# Patient Record
Sex: Male | Born: 1945 | Race: White | Hispanic: No | Marital: Single | Smoking: Former smoker
Health system: Southern US, Community
[De-identification: ages and names within clinical notes are randomized; demographics above are authoritative.]

## PROBLEM LIST (undated history)

## (undated) DIAGNOSIS — E785 Hyperlipidemia, unspecified: Secondary | ICD-10-CM

## (undated) DIAGNOSIS — I10 Essential (primary) hypertension: Secondary | ICD-10-CM

## (undated) DIAGNOSIS — I4891 Unspecified atrial fibrillation: Secondary | ICD-10-CM

## (undated) DIAGNOSIS — F419 Anxiety disorder, unspecified: Secondary | ICD-10-CM

---

## 1981-09-20 HISTORY — PX: BACK SURGERY: SHX140

## 2015-09-14 ENCOUNTER — Inpatient Hospital Stay (HOSPITAL_COMMUNITY)
Admission: EM | Admit: 2015-09-14 | Discharge: 2015-09-21 | DRG: 233 | Disposition: E | Payer: Medicare HMO | Attending: Surgery | Admitting: Surgery

## 2015-09-14 ENCOUNTER — Encounter (HOSPITAL_COMMUNITY): Admission: EM | Disposition: E | Payer: Medicare HMO | Source: Home / Self Care | Attending: Surgery

## 2015-09-14 ENCOUNTER — Encounter (HOSPITAL_COMMUNITY): Payer: Self-pay

## 2015-09-14 ENCOUNTER — Emergency Department (HOSPITAL_COMMUNITY): Payer: Medicare HMO

## 2015-09-14 DIAGNOSIS — Z951 Presence of aortocoronary bypass graft: Secondary | ICD-10-CM

## 2015-09-14 DIAGNOSIS — D72829 Elevated white blood cell count, unspecified: Secondary | ICD-10-CM | POA: Diagnosis present

## 2015-09-14 DIAGNOSIS — R739 Hyperglycemia, unspecified: Secondary | ICD-10-CM | POA: Diagnosis not present

## 2015-09-14 DIAGNOSIS — D62 Acute posthemorrhagic anemia: Secondary | ICD-10-CM | POA: Diagnosis not present

## 2015-09-14 DIAGNOSIS — R079 Chest pain, unspecified: Secondary | ICD-10-CM | POA: Diagnosis not present

## 2015-09-14 DIAGNOSIS — E785 Hyperlipidemia, unspecified: Secondary | ICD-10-CM | POA: Diagnosis present

## 2015-09-14 DIAGNOSIS — I209 Angina pectoris, unspecified: Secondary | ICD-10-CM

## 2015-09-14 DIAGNOSIS — Z66 Do not resuscitate: Secondary | ICD-10-CM | POA: Diagnosis present

## 2015-09-14 DIAGNOSIS — Z4509 Encounter for adjustment and management of other cardiac device: Secondary | ICD-10-CM

## 2015-09-14 DIAGNOSIS — E872 Acidosis: Secondary | ICD-10-CM | POA: Diagnosis not present

## 2015-09-14 DIAGNOSIS — T508X5A Adverse effect of diagnostic agents, initial encounter: Secondary | ICD-10-CM | POA: Diagnosis not present

## 2015-09-14 DIAGNOSIS — I10 Essential (primary) hypertension: Secondary | ICD-10-CM | POA: Diagnosis not present

## 2015-09-14 DIAGNOSIS — J810 Acute pulmonary edema: Secondary | ICD-10-CM | POA: Diagnosis present

## 2015-09-14 DIAGNOSIS — N183 Chronic kidney disease, stage 3 (moderate): Secondary | ICD-10-CM | POA: Diagnosis present

## 2015-09-14 DIAGNOSIS — R579 Shock, unspecified: Secondary | ICD-10-CM | POA: Insufficient documentation

## 2015-09-14 DIAGNOSIS — Z87891 Personal history of nicotine dependence: Secondary | ICD-10-CM

## 2015-09-14 DIAGNOSIS — J9601 Acute respiratory failure with hypoxia: Secondary | ICD-10-CM | POA: Diagnosis not present

## 2015-09-14 DIAGNOSIS — I5021 Acute systolic (congestive) heart failure: Secondary | ICD-10-CM | POA: Diagnosis not present

## 2015-09-14 DIAGNOSIS — R570 Cardiogenic shock: Secondary | ICD-10-CM | POA: Diagnosis not present

## 2015-09-14 DIAGNOSIS — Z452 Encounter for adjustment and management of vascular access device: Secondary | ICD-10-CM

## 2015-09-14 DIAGNOSIS — I2510 Atherosclerotic heart disease of native coronary artery without angina pectoris: Secondary | ICD-10-CM | POA: Diagnosis not present

## 2015-09-14 DIAGNOSIS — F419 Anxiety disorder, unspecified: Secondary | ICD-10-CM | POA: Diagnosis present

## 2015-09-14 DIAGNOSIS — G9341 Metabolic encephalopathy: Secondary | ICD-10-CM | POA: Diagnosis not present

## 2015-09-14 DIAGNOSIS — N170 Acute kidney failure with tubular necrosis: Secondary | ICD-10-CM | POA: Diagnosis not present

## 2015-09-14 DIAGNOSIS — I4891 Unspecified atrial fibrillation: Secondary | ICD-10-CM | POA: Diagnosis not present

## 2015-09-14 DIAGNOSIS — Z01818 Encounter for other preprocedural examination: Secondary | ICD-10-CM

## 2015-09-14 DIAGNOSIS — Z515 Encounter for palliative care: Secondary | ICD-10-CM | POA: Diagnosis not present

## 2015-09-14 DIAGNOSIS — N141 Nephropathy induced by other drugs, medicaments and biological substances: Secondary | ICD-10-CM | POA: Diagnosis not present

## 2015-09-14 DIAGNOSIS — I482 Chronic atrial fibrillation: Secondary | ICD-10-CM | POA: Diagnosis present

## 2015-09-14 DIAGNOSIS — R0602 Shortness of breath: Secondary | ICD-10-CM | POA: Diagnosis present

## 2015-09-14 DIAGNOSIS — I130 Hypertensive heart and chronic kidney disease with heart failure and stage 1 through stage 4 chronic kidney disease, or unspecified chronic kidney disease: Secondary | ICD-10-CM | POA: Diagnosis present

## 2015-09-14 DIAGNOSIS — K559 Vascular disorder of intestine, unspecified: Secondary | ICD-10-CM | POA: Diagnosis not present

## 2015-09-14 DIAGNOSIS — I214 Non-ST elevation (NSTEMI) myocardial infarction: Principal | ICD-10-CM | POA: Diagnosis present

## 2015-09-14 DIAGNOSIS — K567 Ileus, unspecified: Secondary | ICD-10-CM

## 2015-09-14 DIAGNOSIS — E869 Volume depletion, unspecified: Secondary | ICD-10-CM | POA: Diagnosis not present

## 2015-09-14 HISTORY — DX: Anxiety disorder, unspecified: F41.9

## 2015-09-14 HISTORY — DX: Essential (primary) hypertension: I10

## 2015-09-14 HISTORY — DX: Unspecified atrial fibrillation: I48.91

## 2015-09-14 HISTORY — PX: CARDIAC CATHETERIZATION: SHX172

## 2015-09-14 HISTORY — DX: Hyperlipidemia, unspecified: E78.5

## 2015-09-14 LAB — COMPREHENSIVE METABOLIC PANEL
ALT: 32 U/L (ref 17–63)
AST: 29 U/L (ref 15–41)
Albumin: 3.6 g/dL (ref 3.5–5.0)
Alkaline Phosphatase: 95 U/L (ref 38–126)
Anion gap: 9 (ref 5–15)
BUN: 33 mg/dL — ABNORMAL HIGH (ref 6–20)
CHLORIDE: 110 mmol/L (ref 101–111)
CO2: 21 mmol/L — ABNORMAL LOW (ref 22–32)
CREATININE: 1.3 mg/dL — AB (ref 0.61–1.24)
Calcium: 9.9 mg/dL (ref 8.9–10.3)
GFR, EST NON AFRICAN AMERICAN: 55 mL/min — AB (ref >60)
Glucose, Bld: 180 mg/dL — ABNORMAL HIGH (ref 65–99)
POTASSIUM: 4.1 mmol/L (ref 3.5–5.1)
Sodium: 140 mmol/L (ref 135–145)
Total Bilirubin: 0.4 mg/dL (ref 0.3–1.2)
Total Protein: 7.1 g/dL (ref 6.5–8.1)

## 2015-09-14 LAB — CBC WITH DIFFERENTIAL/PLATELET
Basophils Absolute: 0.1 10*3/uL (ref 0.0–0.1)
Basophils Relative: 1 %
Eosinophils Absolute: 0.2 10*3/uL (ref 0.0–0.7)
Eosinophils Relative: 2 %
HCT: 37.9 % — ABNORMAL LOW (ref 39.0–52.0)
Hemoglobin: 12.5 g/dL — ABNORMAL LOW (ref 13.0–17.0)
Lymphocytes Relative: 13 %
Lymphs Abs: 1.4 10*3/uL (ref 0.7–4.0)
MCH: 29.8 pg (ref 26.0–34.0)
MCHC: 33 g/dL (ref 30.0–36.0)
MCV: 90.2 fL (ref 78.0–100.0)
Monocytes Absolute: 0.7 10*3/uL (ref 0.1–1.0)
Monocytes Relative: 6 %
Neutro Abs: 8.9 10*3/uL — ABNORMAL HIGH (ref 1.7–7.7)
Neutrophils Relative %: 78 %
Platelets: 216 10*3/uL (ref 150–400)
RBC: 4.2 MIL/uL — ABNORMAL LOW (ref 4.22–5.81)
RDW: 13.7 % (ref 11.5–15.5)
WBC: 11.2 10*3/uL — ABNORMAL HIGH (ref 4.0–10.5)

## 2015-09-14 LAB — I-STAT TROPONIN, ED: TROPONIN I, POC: 0.54 ng/mL — AB (ref 0.00–0.08)

## 2015-09-14 SURGERY — LEFT HEART CATH AND CORONARY ANGIOGRAPHY

## 2015-09-14 MED ORDER — HEPARIN SODIUM (PORCINE) 5000 UNIT/ML IJ SOLN
4000.0000 [IU] | Freq: Once | INTRAMUSCULAR | Status: AC
Start: 2015-09-14 — End: 2015-09-14
  Administered 2015-09-14: 4000 [IU] via INTRAVENOUS
  Filled 2015-09-14: qty 1

## 2015-09-14 MED ORDER — ASPIRIN 325 MG PO TABS
325.0000 mg | ORAL_TABLET | Freq: Once | ORAL | Status: AC
  Administered 2015-09-14: 325 mg via ORAL
  Filled 2015-09-14: qty 1

## 2015-09-14 MED ORDER — FUROSEMIDE 10 MG/ML IJ SOLN: 40.0000 mg | INTRAMUSCULAR | Status: DC

## 2015-09-14 MED ORDER — NITROGLYCERIN 1 MG/10 ML FOR IR/CATH LAB
INTRA_ARTERIAL | Status: AC
  Filled 2015-09-14: qty 10

## 2015-09-14 MED ORDER — HEPARIN BOLUS VIA INFUSION: 60.0000 [IU]/kg | Freq: Once | INTRAVENOUS | Status: DC

## 2015-09-14 MED ORDER — HEPARIN (PORCINE) IN NACL 2-0.9 UNIT/ML-% IJ SOLN
INTRAMUSCULAR | Status: AC
  Filled 2015-09-14: qty 1000

## 2015-09-14 MED ORDER — LIDOCAINE HCL (PF) 1 % IJ SOLN
INTRAMUSCULAR | Status: AC
  Filled 2015-09-14: qty 30

## 2015-09-14 MED ORDER — VERAPAMIL HCL 2.5 MG/ML IV SOLN
INTRAVENOUS | Status: DC | PRN
  Administered 2015-09-14: via INTRA_ARTERIAL

## 2015-09-14 SURGICAL SUPPLY — 20 items
BALLN LINEAR 7.5FR IABP 34CC (BALLOONS) ×3
BALLOON LINEAR 7.5FR IABP 34CC (BALLOONS) ×1 IMPLANT
CATH INFINITI 5 FR JL3.5 (CATHETERS) ×3 IMPLANT
CATH INFINITI 5FR AL1 (CATHETERS) ×3 IMPLANT
CATH INFINITI 5FR ANG PIGTAIL (CATHETERS) ×3 IMPLANT
CATH INFINITI JR4 5F (CATHETERS) ×3 IMPLANT
CATH SITESEER 5F NTR (CATHETERS) ×3 IMPLANT
DEVICE RAD COMP TR BAND LRG (VASCULAR PRODUCTS) IMPLANT
DEVICE SECURE STATLOCK IABP (MISCELLANEOUS) ×12 IMPLANT
GLIDESHEATH SLEND SS 6F .021 (SHEATH) ×3 IMPLANT
HOVERMATT SINGLE USE (MISCELLANEOUS) ×3 IMPLANT
KIT ENCORE 26 ADVANTAGE (KITS) IMPLANT
KIT HEART LEFT (KITS) ×3 IMPLANT
PACK CARDIAC CATHETERIZATION (CUSTOM PROCEDURE TRAY) ×3 IMPLANT
SHEATH PINNACLE 7F 10CM (SHEATH) ×3 IMPLANT
SYR MEDRAD MARK V 150ML (SYRINGE) ×3 IMPLANT
TRANSDUCER W/STOPCOCK (MISCELLANEOUS) ×3 IMPLANT
TUBING CIL FLEX 10 FLL-RA (TUBING) ×3 IMPLANT
WIRE HI TORQ VERSACORE-J 145CM (WIRE) ×3 IMPLANT
WIRE SAFE-T 1.5MM-J .035X260CM (WIRE) ×3 IMPLANT

## 2015-09-14 NOTE — ED Provider Notes (Signed)
CSN: [id number]     Arrival date & time 02-Oct-2015  2248 History   First MD Initiated Contact with Patient 10-02-2015 2304     Chief Complaint  Patient presents with  . Code STEMI   Corrion Stirewalt is a 69 y.o. male with a history of anxiety and hypertension who presents to the emergency department by [facility] complaining of substernal nonradiating chest pain for the past hour and a half. Upon arrival to the emergency department EKG was obtained revealing STEMI. EKG provided by EMS, which was left at bedside also indicates acute ischemia and STEMI. STEMI was called by EMS. EMS provided the patient with Cardizem prior to arrival. Upon evaluation the patient reports his chest pain has resolved. He reports he's had intermittent chest pain for the past week that it did not worsen until today and about on half ago. He denies history of MI. He reports he sees a cardiologist for an unknown reason. The cardiologist is in [place]. He denies history of atrial fibrillation. He denies fevers, coughing, shortness of breath, abdominal pain, nausea, vomiting or leg pain. He is not a smoker.   (Consider location/radiation/quality/duration/timing/severity/associated sxs/prior Treatment) HPI  Past Medical History  Diagnosis Date  . Anxiety   . Hypertension   . Atrial fibrillation [facility])    Past Surgical History  Procedure Laterality Date  . Back surgery  1983   Family History  Problem Relation Age of Onset  . Heart attack Brother    Social History  Substance Use Topics  . Smoking status: Former Smoker    Quit date: 09/13/2005  . Smokeless tobacco: Former Neurosurgeon  . Alcohol Use: No    Review of Systems  Constitutional: Negative for fever and chills.  Eyes: Negative for visual disturbance.  Respiratory: Negative for cough and shortness of breath.   Cardiovascular: Positive for chest pain. Negative for palpitations and leg swelling.  Gastrointestinal: Negative for nausea, vomiting, abdominal pain  and diarrhea.  Genitourinary: Negative for dysuria.  Musculoskeletal: Negative for back pain and neck pain.  Skin: Negative for rash.  Neurological: Negative for syncope, light-headedness and headaches.      Allergies  Review of patient's allergies indicates no known allergies.  Home Medications   Prior to Admission medications   Not on File   BP 186/75 mmHg  Pulse 91  Temp(Src) 98.4 F (36.9 C) (Oral)  Resp 30  SpO2 96% Physical Exam  Constitutional: He is oriented to person, place, and time. He appears well-developed and well-nourished. No distress.  HENT:  Head: Normocephalic and atraumatic.  Eyes: Conjunctivae are normal. Pupils are equal, round, and reactive to light. Right eye exhibits no discharge. Left eye exhibits no discharge.  Neck: Normal range of motion. Neck supple. No JVD present. No tracheal deviation present.  Cardiovascular: Normal rate, normal heart sounds and intact distal pulses.  Exam reveals no gallop and no friction rub.   No murmur heard. Irregularly irregular rhythm with a rate of 108. Bilateral radial, posterior tibialis and dorsalis pedis pulses are intact.    Pulmonary/Chest: Effort normal and breath sounds normal. No respiratory distress. He has no wheezes. He has no rales. He exhibits no tenderness.  Lungs are clear to auscultation bilaterally.  Abdominal: Soft. He exhibits no distension. There is no tenderness.  Musculoskeletal: He exhibits edema. He exhibits no tenderness.  Mild bilateral symmetric lower extremity edema without calf tenderness.  Lymphadenopathy:    He has no cervical adenopathy.  Neurological: He is alert and oriented  to person, place, and time. Coordination normal.  Skin: Skin is warm and dry. No rash noted. He is not diaphoretic.  Psychiatric: He has a normal mood and affect. His behavior is normal.  Nursing note and vitals reviewed.   ED Course  Procedures (including critical care time) Labs Review Labs Reviewed   CBC WITH DIFFERENTIAL/PLATELET - Abnormal; Notable for the following:    WBC 11.2 (*)    RBC 4.20 (*)    Hemoglobin 12.5 (*)    HCT 37.9 (*)    Neutro Abs 8.9 (*)    All other components within normal limits  COMPREHENSIVE METABOLIC PANEL - Abnormal; Notable for the following:    CO2 21 (*)    Glucose, Bld 180 (*)    BUN 33 (*)    Creatinine, Ser 1.30 (*)    GFR calc non Af Amer 55 (*)    All other components within normal limits  I-STAT TROPOININ, ED - Abnormal; Notable for the following:    Troponin i, poc 0.54 (*)    All other components within normal limits    Imaging Review Dg Chest Port 1 View  09/13/2015  CLINICAL DATA:  Acute onset of generalized chest pain. Code ST-elevation myocardial infarction. Initial encounter. EXAM: PORTABLE CHEST 1 VIEW COMPARISON:  None. FINDINGS: The lungs are well-aerated. Mild bibasilar opacities may reflect atelectasis or minimal interstitial edema. There is no evidence of pleural effusion or pneumothorax. The cardiomediastinal silhouette is borderline normal in size. No acute osseous abnormalities are seen. IMPRESSION: Mild bibasilar opacities may reflect atelectasis or minimal interstitial edema. Electronically Signed   By: Roanna Raider M.D.   On: 09/20/2015 23:40   I have personally reviewed and evaluated these images and lab results as part of my medical decision-making.   EKG Interpretation   Date/Time:  Sunday September 14 2015 23:01:28 EST Ventricular Rate:  103 PR Interval:    QRS Duration: 94 QT Interval:  290 QTC Calculation: 379 R Axis:   -70 Text Interpretation:  Atrial fibrillation LAD, consider left anterior  fascicular block Repol abnrm, severe global ischemia (LM/MVD) Baseline  wander in lead(s) V4 STEMI similar to previous  Confirmed by YAO  MD,  DAVID ([id number]) on 08/30/2015 11:06:32 PM      Filed Vitals:   09/07/2015 2248 09/06/2015 2249 09/06/2015 2255 08/26/2015 2300  BP: 181/99  181/99 186/75  Pulse: 102  104 91  Temp:    98.4 F (36.9 C)   TempSrc:   Oral   Resp:   20 30  SpO2: 96% 98% 95% 96%     MDM   Meds given in ED:  Medications  furosemide (LASIX) injection 40 mg (not administered)  Radial Cocktail/Verapamil only ( Intra-arterial Given 08/21/2015 2341)  aspirin tablet 325 mg (325 mg Oral Given 08/30/2015 2305)  heparin injection 4,000 Units (4,000 Units Intravenous Given 09/19/2015 2308)    There are no discharge medications for this patient.   Final diagnoses:  Ischemic chest pain (HCC)   Patient's EKG indicates STEMI with elevations in aVR. Patient is in A. fib. Code STEMI activated at 23:01 by Dr. Silverio Lay. EKG shows A. fib. Heart rate is 104. Patient is hypertensive. He currently denies chest pain after cardizem by EMS. Will hold NTG at this time.  Heparin bolus ordered.  Cardiology at bedside to evaluate the patient at 23:10. Patient will go to cath lab.  I consulted with Viviann Spare from [facility] about alerting medical control with ST elevation. He will  report this to the crew.   CRITICAL CARE Performed by: Eriel Doyon Duncan   Total critical careLawana Chambers time: 35 minutes  Critical care time was exclusive of separately billable procedures and treating other patients.  Critical care was necessary to treat or prevent imminent or life-threatening deterioration.  Critical care was time spent personally by me on the following activities: development of treatment plan with patient and/or surrogate as well as nursing, discussions with consultants, evaluation of patient's response to treatment, examination of patient, obtaining history from patient or surrogate, ordering and performing treatments and interventions, ordering and review of laboratory studies, ordering and review of radiographic studies, pulse oximetry and re-evaluation of patient's condition.    Everlene FarrierWilliam Marjean Imperato, PA-C 09/01/2015 0124  Richardean Canalavid H Yao, MD 09/16/15 [phone]

## 2015-09-14 NOTE — ED Notes (Signed)
[facility] contacted to page Code Stemi 

## 2015-09-14 NOTE — H&P (Signed)
CARDIOLOGY INPATIENT HISTORY AND PHYSICAL EXAMINATION NOTE  Patient ID: Jason Schroeder MRN: [id number], DOB/AGE: 67/05/1946   Admit date: 09/02/2015   [place] Primary Cardiologist: none/unassigned  Reason for admission: chest pain/SOB high risk MI  HPI: This is a 69 y.o.white male with no prior history of CAD but has hypertension, rate controlled atrial fibrillation (not on A/C, dx 1 year ago) presented with 1 week of worsening SOB and had an episode of chest pain with walking today. He has been getting worsening of SOB over the last one week. He denied prior history of MI, CVA, PCI, cardiac cath or positive stress tests, DVT/PE, bleeding, CHF, blood transfusion, cancer or upcoming surgeries. Patient is currently chest pain free. The chest pain lasted for few minutes and disappeared spontaneously. On arrival, he was feeling SOB. His CXR showed pulmonary edema, troponin was elevated to 0.54 on istat.   Problem List: Past Medical History  Diagnosis Date  . Anxiety   . Hypertension   . Atrial fibrillation [facility](HCC)     Past Surgical History  Procedure Laterality Date  . Back surgery  1983     Allergies: No Known Allergies   Home Medications Current Facility-Administered Medications  Medication Dose Route Frequency Provider Last Rate Last Dose  . furosemide (LASIX) injection 40 mg  40 mg Intravenous STAT Everlene FarrierWilliam Dansie, PA-C      . Radial Cocktail/Verapamil only    PRN Peter M SwazilandJordan, MD         Family History  Problem Relation Age of Onset  . Heart attack Brother      Social History   Social History  . Marital Status: Single    Spouse Name: N/A  . Number of Children: N/A  . Years of Education: N/A   Occupational History  . Not on file.   Social History Main Topics  . Smoking status: Former Smoker    Quit date: 09/13/2005  . Smokeless tobacco: Former NeurosurgeonUser  . Alcohol Use: No  . Drug Use: Not on file  . Sexual Activity: Not on file    Other Topics Concern  . Not on file   Social History Narrative  . No narrative on file     Review of Systems: General: negative for chills, fever, night sweats or weight changes.  Cardiovascular: chest pain, dyspnea on exertion, PND, orthopnea and palpitations Dermatological: negative for rash Respiratory: dry cough  Urologic: negative for hematuria Abdominal: negative for nausea, vomiting, diarrhea, bright red blood per rectum, melena, or hematemesis Neurologic: negative for visual changes, syncope, or dizziness Endocrine: no diabetes, no hypothyroidism Immunological: no lymph adenopathy Psych: non homicidal/suicidal  Physical Exam: Vitals: BP 186/75 mmHg  Pulse 91  Temp(Src) 98.4 F (36.9 C) (Oral)  Resp 30  SpO2 96% General: in mild respiratory distress Neck: JVP elevated, neck supple Heart: irregular rate and rhythm, S1, S2, no murmurs  Lungs: bilateral crackles GI: non tender, non distended, bowel sounds present Extremities: no edema Neuro: AAO x 3  Psych: normal affect, no anxiety  Labs:   Results for orders placed or performed during the hospital encounter of 09/11/2015 (from the past 24 hour(s))  CBC with Differential     Status: Abnormal   Collection Time: 08/25/2015 11:02 PM  Result Value Ref Range   WBC 11.2 (H) 4.0 - 10.5 K/uL   RBC 4.20 (L) 4.22 - 5.81 MIL/uL   Hemoglobin 12.5 (L) 13.0 - 17.0 g/dL   HCT 01.037.9 (L) 27.239.0 - 53.652.0 %  MCV 90.2 78.0 - 100.0 fL   MCH 29.8 26.0 - 34.0 pg   MCHC 33.0 30.0 - 36.0 g/dL   RDW 46.9 62.9 - 52.8 %   Platelets 216 150 - 400 K/uL   Neutrophils Relative % 78 %   Neutro Abs 8.9 (H) 1.7 - 7.7 K/uL   Lymphocytes Relative 13 %   Lymphs Abs 1.4 0.7 - 4.0 K/uL   Monocytes Relative 6 %   Monocytes Absolute 0.7 0.1 - 1.0 K/uL   Eosinophils Relative 2 %   Eosinophils Absolute 0.2 0.0 - 0.7 K/uL   Basophils Relative 1 %   Basophils Absolute 0.1 0.0 - 0.1 K/uL  Comprehensive metabolic panel     Status: Abnormal   Collection  Time: 08/27/2015 11:02 PM  Result Value Ref Range   Sodium 140 135 - 145 mmol/L   Potassium 4.1 3.5 - 5.1 mmol/L   Chloride 110 101 - 111 mmol/L   CO2 21 (L) 22 - 32 mmol/L   Glucose, Bld 180 (H) 65 - 99 mg/dL   BUN 33 (H) 6 - 20 mg/dL   Creatinine, Ser 4.13 (H) 0.61 - 1.24 mg/dL   Calcium 9.9 8.9 - 24.4 mg/dL   Total Protein 7.1 6.5 - 8.1 g/dL   Albumin 3.6 3.5 - 5.0 g/dL   AST 29 15 - 41 U/L   ALT 32 17 - 63 U/L   Alkaline Phosphatase 95 38 - 126 U/L   Total Bilirubin 0.4 0.3 - 1.2 mg/dL   GFR calc non Af Amer 55 (L) >60 mL/min   GFR calc Af Amer >60 >60 mL/min   Anion gap 9 5 - 15  I-stat troponin, ED     Status: Abnormal   Collection Time: 09/04/2015 11:16 PM  Result Value Ref Range   Troponin i, poc 0.54 (HH) 0.00 - 0.08 ng/mL   Comment NOTIFIED PHYSICIAN    Comment 3             Radiology/Studies: Dg Chest Port 1 View  08/24/2015  CLINICAL DATA:  Acute onset of generalized chest pain. Code ST-elevation myocardial infarction. Initial encounter. EXAM: PORTABLE CHEST 1 VIEW COMPARISON:  None. FINDINGS: The lungs are well-aerated. Mild bibasilar opacities may reflect atelectasis or minimal interstitial edema. There is no evidence of pleural effusion or pneumothorax. The cardiomediastinal silhouette is borderline normal in size. No acute osseous abnormalities are seen. IMPRESSION: Mild bibasilar opacities may reflect atelectasis or minimal interstitial edema. Electronically Signed   By: Roanna Raider M.D.   On: 08/31/2015 23:40    EKG: atrial fibrillation with RVR 134 bpm, ST elevation in AVR, global ST depression across the precordium   Echo: in AM  Cardiac cath:  09/01/2015 Procedures    IABP Insertion   Left Heart Cath and Coronary Angiography    Conclusion     Ost LM lesion, 95% stenosed.  Ost LAD to Prox LAD lesion, 15% stenosed.  Mid Cx lesion, 40% stenosed.  1st Mrg lesion, 30% stenosed.  Ost RCA lesion, 100% stenosed.  There is mild to moderate left  ventricular systolic dysfunction.  1. Critical ostial left main stenosis 95% supplying a large left dominant system.  2. Occluded small nondominant RCA 3. Mild to moderate LV dysfunction with EF 40-45%. 4. Elevated LV EDP 5. Placement of IABP  Plan: patient is currently pain free and hemodynamically stable. Discussed with Dr. Laneta Simmers with CT surgery. He will see the patient with plans for Urgent CABG first thing in the  am. Will transfer to ICU and continue hemodynamic support with IV lasix, Ntg, and IABP.      Indications    NSTEMI (non-ST elevated myocardial infarction) (HCC) [I21.4 (ICD-10-CM)]    Technique and Indications    Indication: 69 Yo WM with history of chronic atrial fibrillation ( not on anticoagulation ), HTN, and HL presents with a NSTEMI. Ecg is concerning for left main disease with ST elevation in AvR and diffuse ST depression. Urgent cardiac cath recommended.  Procedural Details: The right wrist was prepped, draped, and anesthetized with 1% lidocaine. Using the modified Seldinger technique, a 6 French slender sheath was introduced into the right radial artery. 3 mg of verapamil was administered through the sheath, weight-based unfractionated heparin was administered intravenously. Standard Judkins catheters were used for selective coronary angiography and left ventriculography. There was significant dampening of pressure with engagement of the left main with a 5 Fr catheter. The RCA was not directly engaged but appeared occluded at the ostium. Catheter exchanges were performed over an exchange length guidewire. There were no immediate procedural complications. Following the diagnostic procedure the right groin was prepped and draped in a sterile fashion. The right femoral artery was accessed with a modified Seldinger technique and an IABP was placed via this access. The patient was transferred to the post catheterization recovery area for further monitoring.  Contrast: 95  cc Estimated blood loss <50 mL. There were no immediate complications during the procedure.    Coronary Findings    Dominance: Left   Left Main   . Ost LM lesion, 95% stenosed. Moderately Calcified. At left main ostium     Left Anterior Descending   . Ost LAD to Prox LAD lesion, 15% stenosed. Moderately Calcified.     Left Circumflex   . Mid Cx lesion, 40% stenosed. Diffuse.   . First Obtuse Marginal Branch   . 1st Mrg lesion, 30% stenosed.     Right Coronary Artery   . Ost RCA lesion, 100% stenosed.       Hemodynamic Support Access site: right femoral artery. An IABP was inserted for hemodynamic support in the setting of cardiogenic shock.    Medical decision making:  Discussed care with the patient Discussed care with the ED physician face to face Reviewed labs and imaging personally Reviewed prior records  ASSESSMENT AND PLAN:  This is a 69 y.o.white male with no prior history of CAD but has hypertension, rate controlled atrial fibrillation (not on A/C) presented with 1 week of worsening SOB and had an episode of chest pain with walking today.    Principal Problem:   NSTEMI (non-ST elevated myocardial infarction) (HCC) Active Problems:   Hypertension, uncontrolled   Acute pulmonary edema (HCC)   SOB (shortness of breath)   Atrial fibrillation with RVR (HCC)  NSTEMI - elevated troponin, complicated by pulmonary edema, Killip class III 1. Emergent cardiac catheterization and PCI 2. Risk stratify with HbA1c, lipid profile, TSH 3. Echocardiogram in the morning 4. Aspirin, antiplatelet, high dose statin and beta blocker if tolerated 5. Medication compliance emphasized to the patient 6. 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 IABP placed and CTSx consulted  Hypertension, uncontrolled Post procedure will need to be started on lisinopril   Atrial fibrillation with rapid ventricular rate, CHADS2VASc =  4 Will start on metoprolol 25 mg BID for rate control If needed will consider IV diltiazem drip for rate control Echocardiogram in the AM Consider  anticoagulation once risk of bleeding improves  Signed, Joellyn Rued, MD MS 09/01/2015, 11:50 PM

## 2015-09-14 NOTE — ED Notes (Signed)
Pt arrived via [facility] from home c/o central chest pain that starts in his throat and goes down for the last week.  Pt has no history of A-fib.  EMS gave 10mg  Cardizem.

## 2015-09-15 ENCOUNTER — Inpatient Hospital Stay (HOSPITAL_COMMUNITY): Payer: Medicare HMO | Admitting: Anesthesiology

## 2015-09-15 ENCOUNTER — Inpatient Hospital Stay (HOSPITAL_COMMUNITY): Payer: Medicare HMO

## 2015-09-15 ENCOUNTER — Encounter (HOSPITAL_COMMUNITY): Payer: Self-pay | Admitting: Cardiology

## 2015-09-15 ENCOUNTER — Encounter (HOSPITAL_COMMUNITY): Admission: EM | Disposition: E | Payer: Medicare HMO | Source: Home / Self Care | Attending: Surgery

## 2015-09-15 DIAGNOSIS — K567 Ileus, unspecified: Secondary | ICD-10-CM | POA: Diagnosis not present

## 2015-09-15 DIAGNOSIS — G9341 Metabolic encephalopathy: Secondary | ICD-10-CM | POA: Diagnosis not present

## 2015-09-15 DIAGNOSIS — K559 Vascular disorder of intestine, unspecified: Secondary | ICD-10-CM | POA: Diagnosis not present

## 2015-09-15 DIAGNOSIS — N141 Nephropathy induced by other drugs, medicaments and biological substances: Secondary | ICD-10-CM | POA: Diagnosis not present

## 2015-09-15 DIAGNOSIS — Z951 Presence of aortocoronary bypass graft: Secondary | ICD-10-CM

## 2015-09-15 DIAGNOSIS — F419 Anxiety disorder, unspecified: Secondary | ICD-10-CM | POA: Diagnosis present

## 2015-09-15 DIAGNOSIS — I4891 Unspecified atrial fibrillation: Secondary | ICD-10-CM | POA: Diagnosis not present

## 2015-09-15 DIAGNOSIS — I2510 Atherosclerotic heart disease of native coronary artery without angina pectoris: Secondary | ICD-10-CM | POA: Diagnosis present

## 2015-09-15 DIAGNOSIS — E785 Hyperlipidemia, unspecified: Secondary | ICD-10-CM | POA: Diagnosis present

## 2015-09-15 DIAGNOSIS — I214 Non-ST elevation (NSTEMI) myocardial infarction: Secondary | ICD-10-CM | POA: Diagnosis not present

## 2015-09-15 DIAGNOSIS — N179 Acute kidney failure, unspecified: Secondary | ICD-10-CM | POA: Diagnosis not present

## 2015-09-15 DIAGNOSIS — N183 Chronic kidney disease, stage 3 (moderate): Secondary | ICD-10-CM | POA: Diagnosis present

## 2015-09-15 DIAGNOSIS — Z66 Do not resuscitate: Secondary | ICD-10-CM | POA: Diagnosis present

## 2015-09-15 DIAGNOSIS — R079 Chest pain, unspecified: Secondary | ICD-10-CM | POA: Diagnosis present

## 2015-09-15 DIAGNOSIS — Z87891 Personal history of nicotine dependence: Secondary | ICD-10-CM | POA: Diagnosis not present

## 2015-09-15 DIAGNOSIS — I25110 Atherosclerotic heart disease of native coronary artery with unstable angina pectoris: Secondary | ICD-10-CM

## 2015-09-15 DIAGNOSIS — E872 Acidosis: Secondary | ICD-10-CM | POA: Diagnosis not present

## 2015-09-15 DIAGNOSIS — D72829 Elevated white blood cell count, unspecified: Secondary | ICD-10-CM | POA: Diagnosis present

## 2015-09-15 DIAGNOSIS — J810 Acute pulmonary edema: Secondary | ICD-10-CM | POA: Diagnosis not present

## 2015-09-15 DIAGNOSIS — I5021 Acute systolic (congestive) heart failure: Secondary | ICD-10-CM

## 2015-09-15 DIAGNOSIS — T508X5A Adverse effect of diagnostic agents, initial encounter: Secondary | ICD-10-CM | POA: Diagnosis not present

## 2015-09-15 DIAGNOSIS — D62 Acute posthemorrhagic anemia: Secondary | ICD-10-CM | POA: Diagnosis not present

## 2015-09-15 DIAGNOSIS — I10 Essential (primary) hypertension: Secondary | ICD-10-CM | POA: Diagnosis not present

## 2015-09-15 DIAGNOSIS — Z452 Encounter for adjustment and management of vascular access device: Secondary | ICD-10-CM | POA: Diagnosis not present

## 2015-09-15 DIAGNOSIS — J9601 Acute respiratory failure with hypoxia: Secondary | ICD-10-CM | POA: Diagnosis not present

## 2015-09-15 DIAGNOSIS — R739 Hyperglycemia, unspecified: Secondary | ICD-10-CM | POA: Diagnosis not present

## 2015-09-15 DIAGNOSIS — I319 Disease of pericardium, unspecified: Secondary | ICD-10-CM | POA: Diagnosis not present

## 2015-09-15 DIAGNOSIS — R579 Shock, unspecified: Secondary | ICD-10-CM | POA: Diagnosis not present

## 2015-09-15 DIAGNOSIS — I130 Hypertensive heart and chronic kidney disease with heart failure and stage 1 through stage 4 chronic kidney disease, or unspecified chronic kidney disease: Secondary | ICD-10-CM | POA: Diagnosis present

## 2015-09-15 DIAGNOSIS — R570 Cardiogenic shock: Secondary | ICD-10-CM | POA: Diagnosis not present

## 2015-09-15 DIAGNOSIS — Z515 Encounter for palliative care: Secondary | ICD-10-CM | POA: Diagnosis not present

## 2015-09-15 DIAGNOSIS — N170 Acute kidney failure with tubular necrosis: Secondary | ICD-10-CM | POA: Diagnosis not present

## 2015-09-15 DIAGNOSIS — I209 Angina pectoris, unspecified: Secondary | ICD-10-CM | POA: Insufficient documentation

## 2015-09-15 DIAGNOSIS — I482 Chronic atrial fibrillation: Secondary | ICD-10-CM | POA: Diagnosis present

## 2015-09-15 DIAGNOSIS — E869 Volume depletion, unspecified: Secondary | ICD-10-CM | POA: Diagnosis not present

## 2015-09-15 HISTORY — PX: TEE WITHOUT CARDIOVERSION: SHX5443

## 2015-09-15 HISTORY — PX: CORONARY ARTERY BYPASS GRAFT: SHX141

## 2015-09-15 LAB — BLOOD GAS, ARTERIAL
Acid-base deficit: 1.9 mmol/L (ref 0.0–2.0)
Bicarbonate: 22.2 mEq/L (ref 20.0–24.0)
DRAWN BY: 41977
FIO2: 0.21
O2 SAT: 94.4 %
PATIENT TEMPERATURE: 98.6
PO2 ART: 74.3 mmHg — AB (ref 80.0–100.0)
TCO2: 23.3 mmol/L (ref 0–100)
pCO2 arterial: 36.8 mmHg (ref 35.0–45.0)
pH, Arterial: 7.398 (ref 7.350–7.450)

## 2015-09-15 LAB — PLATELET COUNT: Platelets: 156 10*3/uL (ref 150–400)

## 2015-09-15 LAB — GLUCOSE, CAPILLARY
GLUCOSE-CAPILLARY: 105 mg/dL — AB (ref 65–99)
GLUCOSE-CAPILLARY: 104 mg/dL — AB (ref 65–99)
GLUCOSE-CAPILLARY: 134 mg/dL — AB (ref 65–99)
GLUCOSE-CAPILLARY: 138 mg/dL — AB (ref 65–99)
Glucose-Capillary: 138 mg/dL — ABNORMAL HIGH (ref 65–99)
Glucose-Capillary: 147 mg/dL — ABNORMAL HIGH (ref 65–99)
Glucose-Capillary: 127 mg/dL — ABNORMAL HIGH (ref 65–99)
Glucose-Capillary: 101 mg/dL — ABNORMAL HIGH (ref 65–99)

## 2015-09-15 LAB — POCT I-STAT 3, ART BLOOD GAS (G3+)
ACID-BASE DEFICIT: 3 mmol/L — AB (ref 0.0–2.0)
ACID-BASE DEFICIT: 6 mmol/L — AB (ref 0.0–2.0)
ACID-BASE EXCESS: 2 mmol/L (ref 0.0–2.0)
BICARBONATE: 26.3 meq/L — AB (ref 20.0–24.0)
BICARBONATE: 20.5 meq/L (ref 20.0–24.0)
Bicarbonate: 22.5 mEq/L (ref 20.0–24.0)
O2 SAT: 100 %
O2 SAT: 95 %
O2 SAT: 94 %
PCO2 ART: 41.7 mmHg (ref 35.0–45.0)
PO2 ART: 266 mmHg — AB (ref 80.0–100.0)
Patient temperature: 37.1
TCO2: 28 mmol/L (ref 0–100)
TCO2: 24 mmol/L (ref 0–100)
TCO2: 22 mmol/L (ref 0–100)
pCO2 arterial: 41.2 mmHg (ref 35.0–45.0)
pCO2 arterial: 46.7 mmHg — ABNORMAL HIGH (ref 35.0–45.0)
pH, Arterial: 7.413 (ref 7.350–7.450)
pH, Arterial: 7.34 — ABNORMAL LOW (ref 7.350–7.450)
pH, Arterial: 7.258 — ABNORMAL LOW (ref 7.350–7.450)
pO2, Arterial: 80 mmHg (ref 80.0–100.0)
pO2, Arterial: 90 mmHg (ref 80.0–100.0)

## 2015-09-15 LAB — SURGICAL PCR SCREEN
MRSA, PCR: NEGATIVE
STAPHYLOCOCCUS AUREUS: NEGATIVE

## 2015-09-15 LAB — POCT I-STAT, CHEM 8
BUN: 34 mg/dL — ABNORMAL HIGH (ref 6–20)
BUN: 34 mg/dL — ABNORMAL HIGH (ref 6–20)
BUN: 29 mg/dL — AB (ref 6–20)
BUN: 32 mg/dL — AB (ref 6–20)
BUN: 34 mg/dL — AB (ref 6–20)
BUN: 33 mg/dL — ABNORMAL HIGH (ref 6–20)
CALCIUM ION: 1.23 mmol/L (ref 1.13–1.30)
CALCIUM ION: 1.11 mmol/L — AB (ref 1.13–1.30)
CALCIUM ION: 1.17 mmol/L (ref 1.13–1.30)
CHLORIDE: 103 mmol/L (ref 101–111)
CREATININE: 1.2 mg/dL (ref 0.61–1.24)
CREATININE: 1.3 mg/dL — AB (ref 0.61–1.24)
CREATININE: 1.4 mg/dL — AB (ref 0.61–1.24)
CREATININE: 1.5 mg/dL — AB (ref 0.61–1.24)
CREATININE: 1.9 mg/dL — AB (ref 0.61–1.24)
Calcium, Ion: 1.23 mmol/L (ref 1.13–1.30)
Calcium, Ion: 1.1 mmol/L — ABNORMAL LOW (ref 1.13–1.30)
Calcium, Ion: 1.2 mmol/L (ref 1.13–1.30)
Chloride: 103 mmol/L (ref 101–111)
Chloride: 100 mmol/L — ABNORMAL LOW (ref 101–111)
Chloride: 100 mmol/L — ABNORMAL LOW (ref 101–111)
Chloride: 101 mmol/L (ref 101–111)
Chloride: 107 mmol/L (ref 101–111)
Creatinine, Ser: 1.4 mg/dL — ABNORMAL HIGH (ref 0.61–1.24)
GLUCOSE: 158 mg/dL — AB (ref 65–99)
GLUCOSE: 139 mg/dL — AB (ref 65–99)
GLUCOSE: 153 mg/dL — AB (ref 65–99)
GLUCOSE: 161 mg/dL — AB (ref 65–99)
Glucose, Bld: 119 mg/dL — ABNORMAL HIGH (ref 65–99)
Glucose, Bld: 140 mg/dL — ABNORMAL HIGH (ref 65–99)
HCT: 35 % — ABNORMAL LOW (ref 39.0–52.0)
HCT: 27 % — ABNORMAL LOW (ref 39.0–52.0)
HCT: 28 % — ABNORMAL LOW (ref 39.0–52.0)
HEMATOCRIT: 37 % — AB (ref 39.0–52.0)
HEMATOCRIT: 28 % — AB (ref 39.0–52.0)
HEMATOCRIT: 27 % — AB (ref 39.0–52.0)
HEMOGLOBIN: 12.6 g/dL — AB (ref 13.0–17.0)
HEMOGLOBIN: 11.9 g/dL — AB (ref 13.0–17.0)
HEMOGLOBIN: 9.2 g/dL — AB (ref 13.0–17.0)
HEMOGLOBIN: 9.5 g/dL — AB (ref 13.0–17.0)
Hemoglobin: 9.5 g/dL — ABNORMAL LOW (ref 13.0–17.0)
Hemoglobin: 9.2 g/dL — ABNORMAL LOW (ref 13.0–17.0)
POTASSIUM: 4.1 mmol/L (ref 3.5–5.1)
POTASSIUM: 4.1 mmol/L (ref 3.5–5.1)
POTASSIUM: 5.1 mmol/L (ref 3.5–5.1)
Potassium: 4.1 mmol/L (ref 3.5–5.1)
Potassium: 5.3 mmol/L — ABNORMAL HIGH (ref 3.5–5.1)
Potassium: 4.3 mmol/L (ref 3.5–5.1)
SODIUM: 142 mmol/L (ref 135–145)
Sodium: 142 mmol/L (ref 135–145)
Sodium: 139 mmol/L (ref 135–145)
Sodium: 138 mmol/L (ref 135–145)
Sodium: 136 mmol/L (ref 135–145)
Sodium: 141 mmol/L (ref 135–145)
TCO2: 27 mmol/L (ref 0–100)
TCO2: 27 mmol/L (ref 0–100)
TCO2: 27 mmol/L (ref 0–100)
TCO2: 24 mmol/L (ref 0–100)
TCO2: 26 mmol/L (ref 0–100)
TCO2: 20 mmol/L (ref 0–100)

## 2015-09-15 LAB — POCT I-STAT 4, (NA,K, GLUC, HGB,HCT)
Glucose, Bld: 124 mg/dL — ABNORMAL HIGH (ref 65–99)
HEMATOCRIT: 30 % — AB (ref 39.0–52.0)
Hemoglobin: 10.2 g/dL — ABNORMAL LOW (ref 13.0–17.0)
Potassium: 4.2 mmol/L (ref 3.5–5.1)
Sodium: 139 mmol/L (ref 135–145)

## 2015-09-15 LAB — CBC
HCT: 29.8 % — ABNORMAL LOW (ref 39.0–52.0)
HEMATOCRIT: 37.7 % — AB (ref 39.0–52.0)
HEMATOCRIT: 30.6 % — AB (ref 39.0–52.0)
HEMOGLOBIN: 9.9 g/dL — AB (ref 13.0–17.0)
Hemoglobin: 12.5 g/dL — ABNORMAL LOW (ref 13.0–17.0)
Hemoglobin: 10.1 g/dL — ABNORMAL LOW (ref 13.0–17.0)
MCH: 29.8 pg (ref 26.0–34.0)
MCH: 30.7 pg (ref 26.0–34.0)
MCH: 29.9 pg (ref 26.0–34.0)
MCHC: 33.2 g/dL (ref 30.0–36.0)
MCHC: 33.9 g/dL (ref 30.0–36.0)
MCHC: 32.4 g/dL (ref 30.0–36.0)
MCV: 89.8 fL (ref 78.0–100.0)
MCV: 90.6 fL (ref 78.0–100.0)
MCV: 92.4 fL (ref 78.0–100.0)
PLATELETS: 284 10*3/uL (ref 150–400)
PLATELETS: 126 10*3/uL — AB (ref 150–400)
Platelets: 145 10*3/uL — ABNORMAL LOW (ref 150–400)
RBC: 4.2 MIL/uL — ABNORMAL LOW (ref 4.22–5.81)
RBC: 3.29 MIL/uL — ABNORMAL LOW (ref 4.22–5.81)
RBC: 3.31 MIL/uL — ABNORMAL LOW (ref 4.22–5.81)
RDW: 13.6 % (ref 11.5–15.5)
RDW: 13.7 % (ref 11.5–15.5)
RDW: 14.1 % (ref 11.5–15.5)
WBC: 13.6 10*3/uL — AB (ref 4.0–10.5)
WBC: 12.9 10*3/uL — AB (ref 4.0–10.5)
WBC: 12.7 10*3/uL — ABNORMAL HIGH (ref 4.0–10.5)

## 2015-09-15 LAB — ABO/RH: ABO/RH(D): B POS

## 2015-09-15 LAB — POCT ACTIVATED CLOTTING TIME: Activated Clotting Time: 317 seconds

## 2015-09-15 LAB — MAGNESIUM: Magnesium: 3.2 mg/dL — ABNORMAL HIGH (ref 1.7–2.4)

## 2015-09-15 LAB — URINALYSIS, ROUTINE W REFLEX MICROSCOPIC
Bilirubin Urine: NEGATIVE
Glucose, UA: NEGATIVE mg/dL
Ketones, ur: NEGATIVE mg/dL
LEUKOCYTES UA: NEGATIVE
Nitrite: NEGATIVE
Protein, ur: NEGATIVE mg/dL
SPECIFIC GRAVITY, URINE: 1.014 (ref 1.005–1.030)
pH: 5 (ref 5.0–8.0)

## 2015-09-15 LAB — URINE MICROSCOPIC-ADD ON: WBC, UA: NONE SEEN WBC/hpf (ref 0–5)

## 2015-09-15 LAB — CREATININE, SERUM
Creatinine, Ser: 1.99 mg/dL — ABNORMAL HIGH (ref 0.61–1.24)
GFR calc Af Amer: 38 mL/min — ABNORMAL LOW (ref >60)
GFR calc non Af Amer: 33 mL/min — ABNORMAL LOW (ref >60)

## 2015-09-15 LAB — PROTIME-INR
INR: 1.15 (ref 0.00–1.49)
INR: 1.46 (ref 0.00–1.49)
PROTHROMBIN TIME: 14.9 s (ref 11.6–15.2)
PROTHROMBIN TIME: 17.8 s — AB (ref 11.6–15.2)

## 2015-09-15 LAB — HEMOGLOBIN AND HEMATOCRIT, BLOOD
HEMATOCRIT: 26.3 % — AB (ref 39.0–52.0)
HEMOGLOBIN: 8.7 g/dL — AB (ref 13.0–17.0)

## 2015-09-15 LAB — APTT
APTT: 58 s — AB (ref 24–37)
APTT: 32 s (ref 24–37)

## 2015-09-15 LAB — HEPARIN LEVEL (UNFRACTIONATED): HEPARIN UNFRACTIONATED: 0.17 [IU]/mL — AB (ref 0.30–0.70)

## 2015-09-15 SURGERY — CORONARY ARTERY BYPASS GRAFTING (CABG)
Anesthesia: General | Site: Chest

## 2015-09-15 MED ORDER — SODIUM CHLORIDE 0.9 % IV SOLN: 250.0000 mL | INTRAVENOUS | Status: DC | PRN

## 2015-09-15 MED ORDER — LACTATED RINGERS IV SOLN: 500.0000 mL | Freq: Once | INTRAVENOUS | Status: DC | PRN

## 2015-09-15 MED ORDER — HEPARIN (PORCINE) IN NACL 100-0.45 UNIT/ML-% IJ SOLN
1200.0000 [IU]/h | INTRAMUSCULAR | Status: DC
  Administered 2015-09-15: 1200 [IU]/h via INTRAVENOUS
  Filled 2015-09-15 (×2): qty 250

## 2015-09-15 MED ORDER — NITROGLYCERIN IN D5W 200-5 MCG/ML-% IV SOLN
INTRAVENOUS | Status: AC
  Filled 2015-09-15: qty 250

## 2015-09-15 MED ORDER — ATROPINE SULFATE 0.1 MG/ML IJ SOLN
INTRAMUSCULAR | Status: AC
  Filled 2015-09-15: qty 10

## 2015-09-15 MED ORDER — INSULIN REGULAR BOLUS VIA INFUSION
0.0000 [IU] | Freq: Three times a day (TID) | INTRAVENOUS | Status: DC
  Filled 2015-09-15: qty 10

## 2015-09-15 MED ORDER — OXYCODONE HCL 5 MG PO TABS
5.0000 mg | ORAL_TABLET | ORAL | Status: DC | PRN
  Administered 2015-09-16: 10 mg via ORAL
  Filled 2015-09-15: qty 2
  Filled 2015-09-15: qty 1

## 2015-09-15 MED ORDER — ACETAMINOPHEN 160 MG/5ML PO SOLN: 650.0000 mg | Freq: Once | ORAL | Status: AC

## 2015-09-15 MED ORDER — FUROSEMIDE 10 MG/ML IJ SOLN
INTRAMUSCULAR | Status: DC | PRN
  Administered 2015-09-15: 40 mg via INTRAVENOUS

## 2015-09-15 MED ORDER — CHLORHEXIDINE GLUCONATE 0.12 % MT SOLN: 15.0000 mL | Freq: Once | OROMUCOSAL | Status: DC

## 2015-09-15 MED ORDER — FUROSEMIDE 10 MG/ML IJ SOLN
INTRAMUSCULAR | Status: AC
  Filled 2015-09-15: qty 4

## 2015-09-15 MED ORDER — BISACODYL 5 MG PO TBEC: 10.0000 mg | DELAYED_RELEASE_TABLET | Freq: Every day | ORAL | Status: DC

## 2015-09-15 MED ORDER — MORPHINE SULFATE (PF) 2 MG/ML IV SOLN
1.0000 mg | INTRAVENOUS | Status: DC | PRN
  Administered 2015-09-15: 1 mg via INTRAVENOUS
  Filled 2015-09-15: qty 1

## 2015-09-15 MED ORDER — ANTISEPTIC ORAL RINSE SOLUTION (CORINZ)
7.0000 mL | Freq: Four times a day (QID) | OROMUCOSAL | Status: DC
  Administered 2015-09-16 – 2015-09-18 (×9): 7 mL via OROMUCOSAL

## 2015-09-15 MED ORDER — SODIUM CHLORIDE 0.9 % IV SOLN: 250.0000 mL | INTRAVENOUS | Status: DC

## 2015-09-15 MED ORDER — ALBUMIN HUMAN 5 % IV SOLN
INTRAVENOUS | Status: DC | PRN
  Administered 2015-09-15 (×2): via INTRAVENOUS

## 2015-09-15 MED ORDER — ACETAMINOPHEN 650 MG RE SUPP
650.0000 mg | Freq: Once | RECTAL | Status: AC
  Administered 2015-09-15: 650 mg via RECTAL

## 2015-09-15 MED ORDER — METOPROLOL TARTRATE 1 MG/ML IV SOLN
2.5000 mg | INTRAVENOUS | Status: DC | PRN
Start: 2015-09-15 — End: 2015-09-16

## 2015-09-15 MED ORDER — PHENYLEPHRINE HCL 10 MG/ML IJ SOLN
0.0000 ug/min | INTRAVENOUS | Status: DC
  Administered 2015-09-15: 60 ug/min via INTRAVENOUS
  Administered 2015-09-16: 55 ug/min via INTRAVENOUS
  Administered 2015-09-17: 30 ug/min via INTRAVENOUS
  Filled 2015-09-15 (×6): qty 2

## 2015-09-15 MED ORDER — PHENYLEPHRINE HCL 10 MG/ML IJ SOLN
30.0000 ug/min | INTRAVENOUS | Status: DC
  Filled 2015-09-15: qty 2

## 2015-09-15 MED ORDER — TRAMADOL HCL 50 MG PO TABS: 50.0000 mg | ORAL_TABLET | ORAL | Status: DC | PRN

## 2015-09-15 MED ORDER — PROMETHAZINE HCL 25 MG/ML IJ SOLN
12.5000 mg | Freq: Once | INTRAMUSCULAR | Status: AC
  Administered 2015-09-15: 12.5 mg via INTRAVENOUS
  Filled 2015-09-15: qty 1

## 2015-09-15 MED ORDER — ROCURONIUM BROMIDE 100 MG/10ML IV SOLN
INTRAVENOUS | Status: DC | PRN
  Administered 2015-09-15: 50 mg via INTRAVENOUS
  Administered 2015-09-15: 20 mg via INTRAVENOUS
  Administered 2015-09-15: 30 mg via INTRAVENOUS
  Administered 2015-09-15 (×2): 50 mg via INTRAVENOUS
  Administered 2015-09-15: 30 mg via INTRAVENOUS

## 2015-09-15 MED ORDER — PAPAVERINE HCL 30 MG/ML IJ SOLN
INTRAMUSCULAR | Status: AC
  Administered 2015-09-15: 500 mL
  Filled 2015-09-15: qty 2.5

## 2015-09-15 MED ORDER — HEMOSTATIC AGENTS (NO CHARGE) OPTIME
TOPICAL | Status: DC | PRN
  Administered 2015-09-15 (×2): 1 via TOPICAL

## 2015-09-15 MED ORDER — VANCOMYCIN HCL IN DEXTROSE 1-5 GM/200ML-% IV SOLN
1000.0000 mg | Freq: Once | INTRAVENOUS | Status: AC
  Administered 2015-09-15: 1000 mg via INTRAVENOUS
  Filled 2015-09-15: qty 200

## 2015-09-15 MED ORDER — BISACODYL 5 MG PO TBEC: 5.0000 mg | DELAYED_RELEASE_TABLET | Freq: Once | ORAL | Status: DC

## 2015-09-15 MED ORDER — THROMBIN 20000 UNITS EX SOLR
CUTANEOUS | Status: AC
  Filled 2015-09-15: qty 20000

## 2015-09-15 MED ORDER — SODIUM CHLORIDE 0.9 % IV SOLN
INTRAVENOUS | Status: DC
  Filled 2015-09-15: qty 2.5

## 2015-09-15 MED ORDER — NITROGLYCERIN IN D5W 200-5 MCG/ML-% IV SOLN: 0.0000 ug/min | INTRAVENOUS | Status: DC

## 2015-09-15 MED ORDER — FAMOTIDINE IN NACL 20-0.9 MG/50ML-% IV SOLN
20.0000 mg | Freq: Two times a day (BID) | INTRAVENOUS | Status: AC
  Administered 2015-09-15 (×2): 20 mg via INTRAVENOUS
  Filled 2015-09-15: qty 50

## 2015-09-15 MED ORDER — ACETAMINOPHEN 325 MG PO TABS
650.0000 mg | ORAL_TABLET | ORAL | Status: DC | PRN
  Administered 2015-09-15: 650 mg via ORAL
  Filled 2015-09-15: qty 2

## 2015-09-15 MED ORDER — DOCUSATE SODIUM 100 MG PO CAPS: 200.0000 mg | ORAL_CAPSULE | Freq: Every day | ORAL | Status: DC

## 2015-09-15 MED ORDER — SODIUM CHLORIDE 0.45 % IV SOLN
INTRAVENOUS | Status: DC | PRN
  Administered 2015-09-15: 20 mL/h via INTRAVENOUS

## 2015-09-15 MED ORDER — ONDANSETRON HCL 4 MG/2ML IJ SOLN
4.0000 mg | Freq: Four times a day (QID) | INTRAMUSCULAR | Status: DC | PRN
  Administered 2015-09-15 (×2): 4 mg via INTRAVENOUS
  Filled 2015-09-15 (×2): qty 2

## 2015-09-15 MED ORDER — DOPAMINE-DEXTROSE 3.2-5 MG/ML-% IV SOLN
3.0000 ug/kg/min | INTRAVENOUS | Status: DC
  Administered 2015-09-17: 3 ug/kg/min via INTRAVENOUS
  Filled 2015-09-15: qty 250

## 2015-09-15 MED ORDER — AMIODARONE HCL IN DEXTROSE 360-4.14 MG/200ML-% IV SOLN
30.0000 mg/h | INTRAVENOUS | Status: DC
  Administered 2015-09-16: 30 mg/h via INTRAVENOUS
  Filled 2015-09-15: qty 200

## 2015-09-15 MED ORDER — HEPARIN SODIUM (PORCINE) 1000 UNIT/ML IJ SOLN
INTRAMUSCULAR | Status: DC | PRN
  Administered 2015-09-15: 30000 [IU] via INTRAVENOUS

## 2015-09-15 MED ORDER — SODIUM CHLORIDE 0.9 % IV SOLN
INTRAVENOUS | Status: AC
  Administered 2015-09-15: 69 mL/h via INTRAVENOUS
  Filled 2015-09-15: qty 40

## 2015-09-15 MED ORDER — SODIUM CHLORIDE 0.9 % IV SOLN: INTRAVENOUS | Status: DC

## 2015-09-15 MED ORDER — SODIUM CHLORIDE 0.9 % IJ SOLN
3.0000 mL | Freq: Two times a day (BID) | INTRAMUSCULAR | Status: DC
  Administered 2015-09-15: 3 mL via INTRAVENOUS

## 2015-09-15 MED ORDER — DEXMEDETOMIDINE HCL IN NACL 400 MCG/100ML IV SOLN
0.1000 ug/kg/h | INTRAVENOUS | Status: AC
  Administered 2015-09-15: .3 ug/kg/h via INTRAVENOUS
  Filled 2015-09-15: qty 100

## 2015-09-15 MED ORDER — METOPROLOL TARTRATE 1 MG/ML IV SOLN
INTRAVENOUS | Status: DC | PRN
  Administered 2015-09-15: 5 mg via INTRAVENOUS

## 2015-09-15 MED ORDER — ROCURONIUM BROMIDE 50 MG/5ML IV SOLN
INTRAVENOUS | Status: AC
  Filled 2015-09-15: qty 1

## 2015-09-15 MED ORDER — ACETAMINOPHEN 160 MG/5ML PO SOLN
1000.0000 mg | Freq: Four times a day (QID) | ORAL | Status: DC
  Administered 2015-09-16 (×2): 1000 mg
  Filled 2015-09-15 (×2): qty 40.6

## 2015-09-15 MED ORDER — PANTOPRAZOLE SODIUM 40 MG PO TBEC: 40.0000 mg | DELAYED_RELEASE_TABLET | Freq: Every day | ORAL | Status: DC

## 2015-09-15 MED ORDER — CHLORHEXIDINE GLUCONATE CLOTH 2 % EX PADS
6.0000 | MEDICATED_PAD | Freq: Once | CUTANEOUS | Status: AC
  Administered 2015-09-15: 6 via TOPICAL

## 2015-09-15 MED ORDER — AMIODARONE LOAD VIA INFUSION
150.0000 mg | Freq: Once | INTRAVENOUS | Status: AC
  Administered 2015-09-15: 150 mg via INTRAVENOUS
  Filled 2015-09-15: qty 83.34

## 2015-09-15 MED ORDER — ASPIRIN EC 325 MG PO TBEC: 325.0000 mg | DELAYED_RELEASE_TABLET | Freq: Every day | ORAL | Status: DC

## 2015-09-15 MED ORDER — DOPAMINE-DEXTROSE 3.2-5 MG/ML-% IV SOLN
0.0000 ug/kg/min | INTRAVENOUS | Status: AC
  Administered 2015-09-15: 3 ug/kg/min via INTRAVENOUS
  Filled 2015-09-15: qty 250

## 2015-09-15 MED ORDER — VANCOMYCIN HCL 10 G IV SOLR
1250.0000 mg | INTRAVENOUS | Status: AC
  Administered 2015-09-15: 1250 mg via INTRAVENOUS
  Filled 2015-09-15: qty 1250

## 2015-09-15 MED ORDER — MIDAZOLAM HCL 2 MG/2ML IJ SOLN
2.0000 mg | INTRAMUSCULAR | Status: DC | PRN
  Administered 2015-09-15: 2 mg via INTRAVENOUS
  Filled 2015-09-15: qty 2

## 2015-09-15 MED ORDER — MORPHINE SULFATE (PF) 2 MG/ML IV SOLN
1.0000 mg | INTRAVENOUS | Status: AC | PRN
  Administered 2015-09-15: 2 mg via INTRAVENOUS

## 2015-09-15 MED ORDER — ONDANSETRON HCL 4 MG/2ML IJ SOLN
4.0000 mg | Freq: Four times a day (QID) | INTRAMUSCULAR | Status: DC | PRN
  Administered 2015-09-16 – 2015-09-17 (×3): 4 mg via INTRAVENOUS
  Filled 2015-09-15 (×3): qty 2

## 2015-09-15 MED ORDER — MAGNESIUM SULFATE 4 GM/100ML IV SOLN
4.0000 g | Freq: Once | INTRAVENOUS | Status: AC
  Administered 2015-09-15: 4 g via INTRAVENOUS
  Filled 2015-09-15: qty 100

## 2015-09-15 MED ORDER — POTASSIUM CHLORIDE 10 MEQ/50ML IV SOLN: 10.0000 meq | INTRAVENOUS | Status: AC

## 2015-09-15 MED ORDER — DEXMEDETOMIDINE HCL IN NACL 200 MCG/50ML IV SOLN
0.0000 ug/kg/h | INTRAVENOUS | Status: DC
Start: 2015-09-15 — End: 2015-09-16
  Filled 2015-09-15: qty 50

## 2015-09-15 MED ORDER — 0.9 % SODIUM CHLORIDE (POUR BTL) OPTIME
TOPICAL | Status: DC | PRN
  Administered 2015-09-15: 5000 mL
  Administered 2015-09-15: 1000 mL

## 2015-09-15 MED ORDER — METOPROLOL TARTRATE 1 MG/ML IV SOLN
INTRAVENOUS | Status: AC
  Filled 2015-09-15: qty 5

## 2015-09-15 MED ORDER — VERAPAMIL HCL 2.5 MG/ML IV SOLN
INTRAVENOUS | Status: AC
  Filled 2015-09-15: qty 2

## 2015-09-15 MED ORDER — AMIODARONE HCL IN DEXTROSE 360-4.14 MG/200ML-% IV SOLN
60.0000 mg/h | INTRAVENOUS | Status: DC
  Administered 2015-09-15 (×2): 60 mg/h via INTRAVENOUS
  Filled 2015-09-15 (×2): qty 200

## 2015-09-15 MED ORDER — DEXTROSE 5 % IV SOLN
750.0000 mg | INTRAVENOUS | Status: DC
  Filled 2015-09-15: qty 750

## 2015-09-15 MED ORDER — LACTATED RINGERS IV SOLN
INTRAVENOUS | Status: DC | PRN
  Administered 2015-09-15: 07:00:00 via INTRAVENOUS

## 2015-09-15 MED ORDER — PROPOFOL 10 MG/ML IV BOLUS
INTRAVENOUS | Status: AC
  Filled 2015-09-15: qty 20

## 2015-09-15 MED ORDER — SODIUM CHLORIDE 0.9 % IJ SOLN: 3.0000 mL | INTRAMUSCULAR | Status: DC | PRN

## 2015-09-15 MED ORDER — MIDAZOLAM HCL 5 MG/5ML IJ SOLN
INTRAMUSCULAR | Status: DC | PRN
  Administered 2015-09-15: 2 mg via INTRAVENOUS
  Administered 2015-09-15: 3 mg via INTRAVENOUS
  Administered 2015-09-15: 2 mg via INTRAVENOUS
  Administered 2015-09-15: 3 mg via INTRAVENOUS

## 2015-09-15 MED ORDER — FENTANYL CITRATE (PF) 100 MCG/2ML IJ SOLN
INTRAMUSCULAR | Status: DC | PRN
  Administered 2015-09-15: 250 ug via INTRAVENOUS
  Administered 2015-09-15: 150 ug via INTRAVENOUS
  Administered 2015-09-15 (×2): 250 ug via INTRAVENOUS
  Administered 2015-09-15: 150 ug via INTRAVENOUS
  Administered 2015-09-15 (×2): 100 ug via INTRAVENOUS

## 2015-09-15 MED ORDER — PHENYLEPHRINE HCL 10 MG/ML IJ SOLN
10.0000 mg | INTRAVENOUS | Status: DC | PRN
  Administered 2015-09-15: 50 ug/min via INTRAVENOUS

## 2015-09-15 MED ORDER — THROMBIN 20000 UNITS EX SOLR: CUTANEOUS | Status: DC | PRN

## 2015-09-15 MED ORDER — BISACODYL 10 MG RE SUPP: 10.0000 mg | Freq: Every day | RECTAL | Status: DC

## 2015-09-15 MED ORDER — DEXTROSE 5 % IV SOLN
1.5000 g | INTRAVENOUS | Status: AC
  Administered 2015-09-15: 1.5 g via INTRAVENOUS
  Administered 2015-09-15: .75 g via INTRAVENOUS
  Filled 2015-09-15: qty 1.5

## 2015-09-15 MED ORDER — PROTAMINE SULFATE 10 MG/ML IV SOLN
INTRAVENOUS | Status: DC | PRN
  Administered 2015-09-15: 40 mg via INTRAVENOUS
  Administered 2015-09-15: 50 mg via INTRAVENOUS
  Administered 2015-09-15 (×2): 20 mg via INTRAVENOUS
  Administered 2015-09-15 (×3): 50 mg via INTRAVENOUS

## 2015-09-15 MED ORDER — LIDOCAINE HCL (PF) 1 % IJ SOLN
INTRAMUSCULAR | Status: DC | PRN
  Administered 2015-09-15: 01:00:00

## 2015-09-15 MED ORDER — FENTANYL CITRATE (PF) 250 MCG/5ML IJ SOLN
INTRAMUSCULAR | Status: AC
  Filled 2015-09-15: qty 25

## 2015-09-15 MED ORDER — ASPIRIN 81 MG PO CHEW
324.0000 mg | CHEWABLE_TABLET | Freq: Every day | ORAL | Status: DC
Start: 2015-09-16 — End: 2015-09-17

## 2015-09-15 MED ORDER — NITROGLYCERIN IN D5W 200-5 MCG/ML-% IV SOLN
INTRAVENOUS | Status: DC | PRN
  Administered 2015-09-15: 5 ug/min via INTRAVENOUS

## 2015-09-15 MED ORDER — LACTATED RINGERS IV SOLN
INTRAVENOUS | Status: DC
  Administered 2015-09-17: 05:00:00 via INTRAVENOUS

## 2015-09-15 MED ORDER — MIDAZOLAM HCL 10 MG/2ML IJ SOLN
INTRAMUSCULAR | Status: AC
  Filled 2015-09-15: qty 2

## 2015-09-15 MED ORDER — TEMAZEPAM 15 MG PO CAPS: 15.0000 mg | ORAL_CAPSULE | Freq: Once | ORAL | Status: DC | PRN

## 2015-09-15 MED ORDER — ETOMIDATE 2 MG/ML IV SOLN
INTRAVENOUS | Status: DC | PRN
  Administered 2015-09-15: 12 mg via INTRAVENOUS

## 2015-09-15 MED ORDER — LACTATED RINGERS IV SOLN: INTRAVENOUS | Status: DC

## 2015-09-15 MED ORDER — DEXTROSE 5 % IV SOLN
1.5000 g | Freq: Two times a day (BID) | INTRAVENOUS | Status: DC
  Administered 2015-09-15 – 2015-09-16 (×3): 1.5 g via INTRAVENOUS
  Filled 2015-09-15 (×4): qty 1.5

## 2015-09-15 MED ORDER — PHENYLEPHRINE HCL 10 MG/ML IJ SOLN
INTRAMUSCULAR | Status: DC | PRN
  Administered 2015-09-15: 120 ug via INTRAVENOUS
  Administered 2015-09-15: 80 ug via INTRAVENOUS
  Administered 2015-09-15: 120 ug via INTRAVENOUS

## 2015-09-15 MED ORDER — ALBUMIN HUMAN 5 % IV SOLN
250.0000 mL | INTRAVENOUS | Status: AC | PRN
  Administered 2015-09-15 (×3): 250 mL via INTRAVENOUS
  Filled 2015-09-15: qty 250

## 2015-09-15 MED ORDER — NITROGLYCERIN IN D5W 200-5 MCG/ML-% IV SOLN
2.0000 ug/min | INTRAVENOUS | Status: DC
  Filled 2015-09-15: qty 250

## 2015-09-15 MED ORDER — EPINEPHRINE HCL 1 MG/ML IJ SOLN
0.0000 ug/min | INTRAVENOUS | Status: DC
  Filled 2015-09-15: qty 4

## 2015-09-15 MED ORDER — ACETAMINOPHEN 500 MG PO TABS
1000.0000 mg | ORAL_TABLET | Freq: Four times a day (QID) | ORAL | Status: DC
  Administered 2015-09-16 (×2): 1000 mg via ORAL
  Filled 2015-09-15 (×3): qty 2

## 2015-09-15 MED ORDER — SUCCINYLCHOLINE CHLORIDE 20 MG/ML IJ SOLN
INTRAMUSCULAR | Status: DC | PRN
  Administered 2015-09-15: 100 mg via INTRAVENOUS

## 2015-09-15 MED ORDER — POTASSIUM CHLORIDE 2 MEQ/ML IV SOLN
80.0000 meq | INTRAVENOUS | Status: DC
  Filled 2015-09-15: qty 40

## 2015-09-15 MED ORDER — MORPHINE SULFATE (PF) 2 MG/ML IV SOLN
2.0000 mg | INTRAVENOUS | Status: DC | PRN
  Administered 2015-09-15 – 2015-09-17 (×5): 2 mg via INTRAVENOUS
  Filled 2015-09-15: qty 2
  Filled 2015-09-15 (×5): qty 1

## 2015-09-15 MED ORDER — SODIUM CHLORIDE 0.9 % IV SOLN
INTRAVENOUS | Status: AC
  Administered 2015-09-15: 2 [IU]/h via INTRAVENOUS
  Filled 2015-09-15: qty 2.5

## 2015-09-15 MED ORDER — MAGNESIUM SULFATE 50 % IJ SOLN
40.0000 meq | INTRAMUSCULAR | Status: DC
  Filled 2015-09-15: qty 10

## 2015-09-15 MED ORDER — METOPROLOL TARTRATE 12.5 MG HALF TABLET: 12.5000 mg | ORAL_TABLET | Freq: Two times a day (BID) | ORAL | Status: DC

## 2015-09-15 MED ORDER — CALCIUM CHLORIDE 10 % IV SOLN
INTRAVENOUS | Status: DC | PRN
  Administered 2015-09-15 (×2): 250 mg via INTRAVENOUS

## 2015-09-15 MED ORDER — SODIUM CHLORIDE 0.9 % IJ SOLN
3.0000 mL | Freq: Two times a day (BID) | INTRAMUSCULAR | Status: DC
  Administered 2015-09-16 – 2015-09-18 (×5): 3 mL via INTRAVENOUS

## 2015-09-15 MED ORDER — CHLORHEXIDINE GLUCONATE 0.12% ORAL RINSE (MEDLINE KIT)
15.0000 mL | Freq: Two times a day (BID) | OROMUCOSAL | Status: DC
  Administered 2015-09-16 – 2015-09-18 (×4): 15 mL via OROMUCOSAL

## 2015-09-15 MED ORDER — METOPROLOL TARTRATE 25 MG/10 ML ORAL SUSPENSION: 12.5000 mg | Freq: Two times a day (BID) | ORAL | Status: DC

## 2015-09-15 MED ORDER — CHLORHEXIDINE GLUCONATE 0.12 % MT SOLN
15.0000 mL | OROMUCOSAL | Status: AC
  Administered 2015-09-15: 15 mL via OROMUCOSAL
  Filled 2015-09-15: qty 15

## 2015-09-15 MED ORDER — IOHEXOL 350 MG/ML SOLN
INTRAVENOUS | Status: DC | PRN
  Administered 2015-09-15: 95 mL via INTRAVENOUS

## 2015-09-15 MED ORDER — ATORVASTATIN CALCIUM 80 MG PO TABS
80.0000 mg | ORAL_TABLET | Freq: Every day | ORAL | Status: DC
Start: 2015-09-15 — End: 2015-09-18
  Administered 2015-09-16: 80 mg via ORAL
  Filled 2015-09-15: qty 1

## 2015-09-15 MED ORDER — SODIUM CHLORIDE 0.9 % IV SOLN
INTRAVENOUS | Status: DC
  Filled 2015-09-15: qty 30

## 2015-09-15 MED ORDER — THROMBIN 20000 UNITS EX SOLR
OROMUCOSAL | Status: DC | PRN
  Administered 2015-09-15 (×4): 4 mL via TOPICAL

## 2015-09-15 SURGICAL SUPPLY — 103 items
BAG DECANTER FOR FLEXI CONT (MISCELLANEOUS) ×3 IMPLANT
BANDAGE ELASTIC 4 VELCRO ST LF (GAUZE/BANDAGES/DRESSINGS) ×6 IMPLANT
BANDAGE ELASTIC 6 VELCRO ST LF (GAUZE/BANDAGES/DRESSINGS) ×6 IMPLANT
BASKET HEART  (ORDER IN 25'S) (MISCELLANEOUS) ×1
BASKET HEART (ORDER IN 25'S) (MISCELLANEOUS) ×1
BASKET HEART (ORDER IN 25S) (MISCELLANEOUS) ×1 IMPLANT
BLADE STERNUM SYSTEM 6 (BLADE) ×3 IMPLANT
BLADE SURG 11 STRL SS (BLADE) ×3 IMPLANT
BNDG GAUZE ELAST 4 BULKY (GAUZE/BANDAGES/DRESSINGS) ×6 IMPLANT
CANISTER SUCTION 2500CC (MISCELLANEOUS) ×3 IMPLANT
CATH ROBINSON RED A/P 18FR (CATHETERS) ×6 IMPLANT
CATH THORACIC 28FR (CATHETERS) ×6 IMPLANT
CATH THORACIC 36FR (CATHETERS) ×3 IMPLANT
CATH THORACIC 36FR RT ANG (CATHETERS) ×3 IMPLANT
CLIP TI MEDIUM 24 (CLIP) ×6 IMPLANT
CLIP TI WIDE RED SMALL 24 (CLIP) ×15 IMPLANT
CONN 3/8X3/8 GISH STERILE (MISCELLANEOUS) ×6 IMPLANT
CONN Y 3/8X3/8X3/8  BEN (MISCELLANEOUS) ×2
CONN Y 3/8X3/8X3/8 BEN (MISCELLANEOUS) ×1 IMPLANT
CONNECTOR 5 IN 1 STRAIGHT STRL (MISCELLANEOUS) ×6 IMPLANT
COVER PROBE W GEL 5X96 (DRAPES) ×3 IMPLANT
COVER SURGICAL LIGHT HANDLE (MISCELLANEOUS) ×3 IMPLANT
CRADLE DONUT ADULT HEAD (MISCELLANEOUS) ×3 IMPLANT
DRAPE CARDIOVASCULAR INCISE (DRAPES) ×2
DRAPE SLUSH/WARMER DISC (DRAPES) ×3 IMPLANT
DRAPE SRG 135X102X78XABS (DRAPES) ×1 IMPLANT
DRSG COVADERM 4X14 (GAUZE/BANDAGES/DRESSINGS) ×3 IMPLANT
ELECT CAUTERY BLADE 6.4 (BLADE) ×3 IMPLANT
ELECT REM PT RETURN 9FT ADLT (ELECTROSURGICAL) ×6
ELECTRODE REM PT RTRN 9FT ADLT (ELECTROSURGICAL) ×2 IMPLANT
GAUZE SPONGE 4X4 12PLY STRL (GAUZE/BANDAGES/DRESSINGS) ×6 IMPLANT
GLOVE BIO SURGEON STRL SZ 6.5 (GLOVE) ×12 IMPLANT
GLOVE BIO SURGEON STRL SZ7 (GLOVE) ×18 IMPLANT
GLOVE BIO SURGEON STRL SZ7.5 (GLOVE) ×3 IMPLANT
GLOVE BIO SURGEONS STRL SZ 6.5 (GLOVE) ×6
GLOVE BIOGEL PI IND STRL 6 (GLOVE) ×2 IMPLANT
GLOVE BIOGEL PI IND STRL 6.5 (GLOVE) ×4 IMPLANT
GLOVE BIOGEL PI INDICATOR 6 (GLOVE) ×4
GLOVE BIOGEL PI INDICATOR 6.5 (GLOVE) ×8
GLOVE EUDERMIC 7 POWDERFREE (GLOVE) ×6 IMPLANT
GOWN STRL REUS W/ TWL LRG LVL3 (GOWN DISPOSABLE) ×8 IMPLANT
GOWN STRL REUS W/ TWL XL LVL3 (GOWN DISPOSABLE) ×1 IMPLANT
GOWN STRL REUS W/TWL LRG LVL3 (GOWN DISPOSABLE) ×16
GOWN STRL REUS W/TWL XL LVL3 (GOWN DISPOSABLE) ×2
HEMOSTAT POWDER SURGIFOAM 1G (HEMOSTASIS) ×9 IMPLANT
HEMOSTAT SURGICEL 2X14 (HEMOSTASIS) ×3 IMPLANT
INSERT FOGARTY 61MM (MISCELLANEOUS) ×3 IMPLANT
KIT BASIN OR (CUSTOM PROCEDURE TRAY) ×3 IMPLANT
KIT CATH CPB BARTLE (MISCELLANEOUS) ×3 IMPLANT
KIT ROOM TURNOVER OR (KITS) ×3 IMPLANT
KIT SUCTION CATH 14FR (SUCTIONS) ×3 IMPLANT
KIT VASOVIEW W/TROCAR VH 2000 (KITS) ×3 IMPLANT
NS IRRIG 1000ML POUR BTL (IV SOLUTION) ×15 IMPLANT
PACK OPEN HEART (CUSTOM PROCEDURE TRAY) ×3 IMPLANT
PAD ARMBOARD 7.5X6 YLW CONV (MISCELLANEOUS) ×6 IMPLANT
PAD ELECT DEFIB RADIOL ZOLL (MISCELLANEOUS) ×3 IMPLANT
PENCIL BUTTON HOLSTER BLD 10FT (ELECTRODE) ×3 IMPLANT
PUNCH AORTIC ROTATE 4.5MM 8IN (MISCELLANEOUS) ×3 IMPLANT
SET CARDIOPLEGIA MPS 5001102 (MISCELLANEOUS) ×3 IMPLANT
SPONGE GAUZE 4X4 12PLY STER LF (GAUZE/BANDAGES/DRESSINGS) ×3 IMPLANT
SPONGE INTESTINAL PEANUT (DISPOSABLE) ×3 IMPLANT
SPONGE LAP 18X18 X RAY DECT (DISPOSABLE) ×12 IMPLANT
SPONGE LAP 4X18 X RAY DECT (DISPOSABLE) ×3 IMPLANT
SUT BONE WAX W31G (SUTURE) ×3 IMPLANT
SUT ETHIBOND 2 0 SH (SUTURE) ×8
SUT ETHIBOND 2 0 SH 36X2 (SUTURE) ×4 IMPLANT
SUT MNCRL AB 4-0 PS2 18 (SUTURE) ×3 IMPLANT
SUT PROLENE 3 0 SH1 36 (SUTURE) ×3 IMPLANT
SUT PROLENE 4 0 RB 1 (SUTURE) ×2
SUT PROLENE 4-0 RB1 .5 CRCL 36 (SUTURE) ×1 IMPLANT
SUT PROLENE 5 0 C 1 36 (SUTURE) ×6 IMPLANT
SUT PROLENE 6 0 C 1 30 (SUTURE) ×12 IMPLANT
SUT PROLENE 7 0 BV 1 (SUTURE) ×6 IMPLANT
SUT PROLENE 7 0 BV1 MDA (SUTURE) ×3 IMPLANT
SUT PROLENE 8 0 BV175 6 (SUTURE) ×12 IMPLANT
SUT SILK  1 MH (SUTURE) ×8
SUT SILK 1 MH (SUTURE) ×4 IMPLANT
SUT SILK 1 TIES 10X30 (SUTURE) ×3 IMPLANT
SUT SILK 2 0 SH CR/8 (SUTURE) ×9 IMPLANT
SUT SILK 3 0 SH CR/8 (SUTURE) ×3 IMPLANT
SUT SILK 4 0 TIE 10X30 (SUTURE) ×6 IMPLANT
SUT STEEL SZ 6 DBL 3X14 BALL (SUTURE) ×9 IMPLANT
SUT TEM PAC WIRE 2 0 SH (SUTURE) ×12 IMPLANT
SUT VIC AB 1 CTX 36 (SUTURE) ×4
SUT VIC AB 1 CTX36XBRD ANBCTR (SUTURE) ×2 IMPLANT
SUT VIC AB 2-0 CT1 27 (SUTURE) ×2
SUT VIC AB 2-0 CT1 TAPERPNT 27 (SUTURE) ×1 IMPLANT
SUT VIC AB 2-0 CTX 27 (SUTURE) IMPLANT
SUT VIC AB 3-0 SH 27 (SUTURE)
SUT VIC AB 3-0 SH 27X BRD (SUTURE) IMPLANT
SUT VIC AB 3-0 X1 27 (SUTURE) ×6 IMPLANT
SUT VICRYL 4-0 PS2 18IN ABS (SUTURE) ×3 IMPLANT
SUTURE E-PAK OPEN HEART (SUTURE) ×3 IMPLANT
SYSTEM SAHARA CHEST DRAIN ATS (WOUND CARE) ×3 IMPLANT
TAPE CLOTH SURG 4X10 WHT LF (GAUZE/BANDAGES/DRESSINGS) ×3 IMPLANT
TAPE CLOTH SURG 6X10 WHT LF (GAUZE/BANDAGES/DRESSINGS) ×3 IMPLANT
TAPE PAPER 2X10 WHT MICROPORE (GAUZE/BANDAGES/DRESSINGS) ×3 IMPLANT
TOWEL OR 17X24 6PK STRL BLUE (TOWEL DISPOSABLE) ×6 IMPLANT
TOWEL OR 17X26 10 PK STRL BLUE (TOWEL DISPOSABLE) ×3 IMPLANT
TRAY FOLEY IC TEMP SENS 16FR (CATHETERS) ×3 IMPLANT
TUBING INSUFFLATION (TUBING) ×3 IMPLANT
UNDERPAD 30X30 INCONTINENT (UNDERPADS AND DIAPERS) ×3 IMPLANT
WATER STERILE IRR 1000ML POUR (IV SOLUTION) ×6 IMPLANT

## 2015-09-15 NOTE — Progress Notes (Signed)
ANTICOAGULATION CONSULT NOTE - Initial Consult  Pharmacy Consult for Heparin Indication: Afib/IABP  No Known Allergies  Patient Measurements: Height: 5\' 4"  (162.6 cm) Weight: 180 lb (81.647 kg) IBW/kg (Calculated) : 59.2 Heparin Dosing Weight: 75 kg  Vital Signs: Temp: 98.4 F (36.9 C) (12/25 2255) Temp Source: Oral (12/25 2255) BP: 186/75 mmHg (12/25 2300) Pulse Rate: 91 (12/25 2300)  Labs:  Recent Labs  09/03/2015 2302  HGB 12.5*  HCT 37.9*  PLT 216  CREATININE 1.30*    Estimated Creatinine Clearance: 53.2 mL/min (by C-G formula based on Cr of 1.3).   Medical History: Past Medical History  Diagnosis Date  . Anxiety   . Hypertension   . Atrial fibrillation (HCC)     Medications:  Lipitor  Klonopin  Digoxin  Zestril  Toprol XL    Assessment: 69 y.o. male with Afib/STEMI found to have critical LM disease, awaiting CABG and on IABP, for heparin.  Received heparin 4000 unit IV bolus in ED at  11 pm  Goal of Therapy:  Heparin level 0.3-0.5 Monitor platelets by anticoagulation protocol: Yes   Plan:  Start heparin 1200 units/hr Check heparin level in 8 hours.   Jason Schroeder, Gary FleetGregory Vernon 08/27/2015,12:14 AM

## 2015-09-15 NOTE — Progress Notes (Signed)
  Echocardiogram Echocardiogram Transesophageal has been performed.  [name], [name] 09/03/2015, 8:36 AM

## 2015-09-15 NOTE — Consult Note (Signed)
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 Peter Martinique  Jason Schroeder is an 69 y.o. male.  HPI:   The patient is a 69 year old non-smoker with a history of hyperlipidemia, hypertension and rate controlled atrial fibrillation not on anticoagulation who presented overnight with a week or so history of shortness of breath and intermittent chest pain. Last night he developed severe chest pain that did not resolved associated with shortness of breath and called EMS. His CXR on arrival showed pulmonary edema and initial POC troponin was 0.54. ECG showed diffuse ST depression. Cath shows 95% ostial LM stenosis with 66F catheter dampening. The LCX is dominant and has 40% mid vessel stenosis. The RCA is occluded at the ostium and the distal vessel not seen. LVEF is 40-45% with an LVEDP of 33. He had an IABP inserted for anatomy and was taken to the CCU when he was diuresed over the past several hrs. His pulmonary edema looks improved this am. He has had no chest pain overnight but has been nauseous and vomited. Breathing is ok.  Past Medical History  Diagnosis Date  . Anxiety   . Hypertension   . Atrial fibrillation ([place])   . Hyperlipidemia     Past Surgical History  Procedure Laterality Date  . Back surgery  1983  appendectomy for appendicitis  Family History  Problem Relation Age of Onset  . Heart attack Brother     Social History:  reports that he quit smoking about 10 years ago. He has quit using smokeless tobacco. He reports that he does not drink alcohol. His drug history is not on file.  Allergies: No Known Allergies  Medications:  I have reviewed the patient's current medications. Prior to Admission:  No prescriptions prior to admission   Scheduled: . aminocaproic  acid (AMICAR) for OHS   Intravenous To OR  . aspirin EC  325 mg Oral Daily  . atorvastatin  80 mg Oral q1800  . atropine      . cefUROXime (ZINACEF)  IV  1.5 g Intravenous To OR  . cefUROXime (ZINACEF)  IV  750 mg Intravenous To OR  . chlorhexidine  15 mL Mouth/Throat Once  . dexmedetomidine  0.1-0.7 mcg/kg/hr Intravenous To OR  . DOPamine  0-10 mcg/kg/min Intravenous To OR  . epinephrine  0-10 mcg/min Intravenous To OR  . furosemide  40 mg Intravenous STAT  . heparin-papaverine-plasmalyte irrigation   Irrigation To OR  . heparin 30,000 units/NS 1000 mL solution for CELLSAVER   Other To OR  . insulin (NOVOLIN-R) infusion   Intravenous To OR  . magnesium sulfate  40 mEq Other To OR  . nitroGLYCERIN  2-200 mcg/min Intravenous To OR  . phenylephrine (NEO-SYNEPHRINE) Adult infusion  30-200 mcg/min Intravenous To OR  . potassium chloride  80 mEq Other To OR  . sodium chloride  3 mL Intravenous Q12H  . sodium chloride  3 mL Intravenous Q12H  . vancomycin  1,250 mg Intravenous To OR   Continuous: . heparin 1,200 Units/hr (08/29/2015 0038)  . nitroGLYCERIN 3 mcg/min (09/17/2015 0400)   IHW:TUUEKC chloride, sodium chloride, acetaminophen, morphine injection, ondansetron (ZOFRAN) IV, sodium chloride, sodium chloride Anti-infectives    Start  Dose/Rate Route Frequency Ordered Stop   08/26/2015 0530  vancomycin (VANCOCIN) 1,250 mg in sodium chloride 0.9 % 250 mL IVPB     1,250 mg 166.7 mL/hr over 90 Minutes Intravenous To Surgery 09/12/2015 0528 09/16/15 0530   08/31/2015 0530  cefUROXime (ZINACEF) 1.5 g in dextrose 5 % 50 mL IVPB     1.5 g 100 mL/hr over 30 Minutes Intravenous To Surgery 08/26/2015 0528 09/16/15 0530   09/12/2015 0530  cefUROXime (ZINACEF) 750 mg in dextrose 5 % 50 mL IVPB     750 mg 100 mL/hr over 30 Minutes Intravenous To Surgery 08/27/2015 0528 09/16/15 0530      Results for orders placed or performed during the hospital encounter of 08/28/2015 (from the past 48 hour(s))  CBC with  Differential     Status: Abnormal   Collection Time: 09/09/2015 11:02 PM  Result Value Ref Range   WBC 11.2 (H) 4.0 - 10.5 K/uL   RBC 4.20 (L) 4.22 - 5.81 MIL/uL   Hemoglobin 12.5 (L) 13.0 - 17.0 g/dL   HCT 37.9 (L) 39.0 - 52.0 %   MCV 90.2 78.0 - 100.0 fL   MCH 29.8 26.0 - 34.0 pg   MCHC 33.0 30.0 - 36.0 g/dL   RDW 13.7 11.5 - 15.5 %   Platelets 216 150 - 400 K/uL   Neutrophils Relative % 78 %   Neutro Abs 8.9 (H) 1.7 - 7.7 K/uL   Lymphocytes Relative 13 %   Lymphs Abs 1.4 0.7 - 4.0 K/uL   Monocytes Relative 6 %   Monocytes Absolute 0.7 0.1 - 1.0 K/uL   Eosinophils Relative 2 %   Eosinophils Absolute 0.2 0.0 - 0.7 K/uL   Basophils Relative 1 %   Basophils Absolute 0.1 0.0 - 0.1 K/uL  Comprehensive metabolic panel     Status: Abnormal   Collection Time: 08/31/2015 11:02 PM  Result Value Ref Range   Sodium 140 135 - 145 mmol/L   Potassium 4.1 3.5 - 5.1 mmol/L   Chloride 110 101 - 111 mmol/L   CO2 21 (L) 22 - 32 mmol/L   Glucose, Bld 180 (H) 65 - 99 mg/dL   BUN 33 (H) 6 - 20 mg/dL   Creatinine, Ser 1.30 (H) 0.61 - 1.24 mg/dL   Calcium 9.9 8.9 - 10.3 mg/dL   Total Protein 7.1 6.5 - 8.1 g/dL   Albumin 3.6 3.5 - 5.0 g/dL   AST 29 15 - 41 U/L   ALT 32 17 - 63 U/L   Alkaline Phosphatase 95 38 - 126 U/L   Total Bilirubin 0.4 0.3 - 1.2 mg/dL   GFR calc non Af Amer 55 (L) >60 mL/min   GFR calc Af Amer >60 >60 mL/min    Comment: (NOTE) The eGFR has been calculated using the CKD EPI equation. This calculation has not been validated in all clinical situations. eGFR's persistently <60 mL/min signify possible Chronic Kidney Disease.    Anion gap 9 5 - 15  I-stat troponin, ED     Status: Abnormal   Collection Time: 09/09/2015 11:16 PM  Result Value Ref Range   Troponin i, poc 0.54 (HH) 0.00 - 0.08 ng/mL   Comment NOTIFIED PHYSICIAN    Comment 3            Comment: Due to the release kinetics of cTnI, a negative result within the first hours of the onset of symptoms does not rule  out myocardial infarction with certainty. If myocardial infarction is still  suspected, repeat the test at appropriate intervals.   Surgical pcr screen     Status: None   Collection Time: 09/04/2015  1:35 AM  Result Value Ref Range   MRSA, PCR NEGATIVE NEGATIVE   Staphylococcus aureus NEGATIVE NEGATIVE    Comment:        The Xpert SA Assay (FDA approved for NASAL specimens in patients over 27 years of age), is one component of a comprehensive surveillance program.  Test performance has been validated by [facility] for patients greater than or equal to 45 year old. It is not intended to diagnose infection nor to guide or monitor treatment.   Heparin level (unfractionated)     Status: Abnormal   Collection Time: 09/13/2015  1:45 AM  Result Value Ref Range   Heparin Unfractionated 0.17 (L) 0.30 - 0.70 IU/mL    Comment:        IF HEPARIN RESULTS ARE BELOW EXPECTED VALUES, AND PATIENT DOSAGE HAS BEEN CONFIRMED, SUGGEST FOLLOW UP TESTING OF ANTITHROMBIN III LEVELS.   CBC     Status: Abnormal   Collection Time: 08/29/2015  1:45 AM  Result Value Ref Range   WBC 13.6 (H) 4.0 - 10.5 K/uL   RBC 4.20 (L) 4.22 - 5.81 MIL/uL   Hemoglobin 12.5 (L) 13.0 - 17.0 g/dL   HCT 37.7 (L) 39.0 - 52.0 %   MCV 89.8 78.0 - 100.0 fL   MCH 29.8 26.0 - 34.0 pg   MCHC 33.2 30.0 - 36.0 g/dL   RDW 13.6 11.5 - 15.5 %   Platelets 284 150 - 400 K/uL  APTT     Status: Abnormal   Collection Time: 09/20/2015  1:45 AM  Result Value Ref Range   aPTT 58 (H) 24 - 37 seconds    Comment:        IF BASELINE aPTT IS ELEVATED, SUGGEST PATIENT RISK ASSESSMENT BE USED TO DETERMINE APPROPRIATE ANTICOAGULANT THERAPY.   Protime-INR     Status: None   Collection Time: 09/05/2015  1:45 AM  Result Value Ref Range   Prothrombin Time 14.9 11.6 - 15.2 seconds   INR 1.15 0.00 - 1.49  Urinalysis, Routine w reflex microscopic (not at [facility])     Status: Abnormal   Collection Time: 09/01/2015  2:26 AM  Result Value Ref Range    Color, Urine YELLOW YELLOW   APPearance CLEAR CLEAR   Specific Gravity, Urine 1.014 1.005 - 1.030   pH 5.0 5.0 - 8.0   Glucose, UA NEGATIVE NEGATIVE mg/dL   Hgb urine dipstick TRACE (A) NEGATIVE   Bilirubin Urine NEGATIVE NEGATIVE   Ketones, ur NEGATIVE NEGATIVE mg/dL   Protein, ur NEGATIVE NEGATIVE mg/dL   Nitrite NEGATIVE NEGATIVE   Leukocytes, UA NEGATIVE NEGATIVE  Urine microscopic-add on     Status: Abnormal   Collection Time: 08/27/2015  2:26 AM  Result Value Ref Range   Squamous Epithelial / LPF 0-5 (A) NONE SEEN   WBC, UA NONE SEEN 0 - 5 WBC/hpf   RBC / HPF 0-5 0 - 5 RBC/hpf   Bacteria, UA RARE (A) NONE SEEN  Type and screen [place]     Status: None   Collection Time: 08/25/2015  2:35 AM  Result Value Ref Range   ABO/RH(D) B POS    Antibody Screen NEG    Sample Expiration 10/03/15   ABO/Rh     Status: None   Collection Time: 09/11/2015  2:35 AM  Result Value Ref Range  ABO/RH(D) B POS   Blood gas, arterial     Status: Abnormal   Collection Time: 09/02/2015  2:46 AM  Result Value Ref Range   FIO2 0.21    pH, Arterial 7.398 7.350 - 7.450   pCO2 arterial 36.8 35.0 - 45.0 mmHg   pO2, Arterial 74.3 (L) 80.0 - 100.0 mmHg   Bicarbonate 22.2 20.0 - 24.0 mEq/L   TCO2 23.3 0 - 100 mmol/L   Acid-base deficit 1.9 0.0 - 2.0 mmol/L   O2 Saturation 94.4 %   Patient temperature 98.6    Collection site LEFT RADIAL    Drawn by [phone]    Sample type ARTERIAL DRAW    Allens test (pass/fail) PASS PASS    Dg Chest Port 1 View  08/27/2015  CLINICAL DATA:  Intra-aortic balloon pump placement. Initial encounter. EXAM: PORTABLE CHEST 1 VIEW COMPARISON:  Chest radiograph from 09/16/2015 FINDINGS: The intra-aortic balloon pump tip appears to be within the descending thoracic aorta, 3 cm below the aortic knob. This could be advanced approximately 2-3 cm. Mild vascular congestion is noted. The lungs remain grossly clear. No focal consolidation, pleural effusion or  pneumothorax is seen. The cardiomediastinal silhouette is borderline normal in size. No acute osseous abnormalities are identified. IMPRESSION: 1. Intra-aortic balloon pump tip appears to be within the descending thoracic aorta, 3 cm below the aortic knob. This could be advanced approximately 2-3 cm. 2. Mild vascular congestion noted.  Lungs remain grossly clear. These results were called by telephone at the time of interpretation on 09/17/2015 at 4:24 am to [place] RN on [facility], who verbally acknowledged these results. Electronically Signed   By: Garald Balding M.D.   On: 08/30/2015 04:25   Dg Chest Port 1 View  09/13/2015  CLINICAL DATA:  Acute onset of generalized chest pain. Code ST-elevation myocardial infarction. Initial encounter. EXAM: PORTABLE CHEST 1 VIEW COMPARISON:  None. FINDINGS: The lungs are well-aerated. Mild bibasilar opacities may reflect atelectasis or minimal interstitial edema. There is no evidence of pleural effusion or pneumothorax. The cardiomediastinal silhouette is borderline normal in size. No acute osseous abnormalities are seen. IMPRESSION: Mild bibasilar opacities may reflect atelectasis or minimal interstitial edema. Electronically Signed   By: Garald Balding M.D.   On: 08/31/2015 23:40    Review of Systems  Constitutional: Negative for fever, chills and malaise/fatigue.  HENT: Negative.   Eyes: Negative.   Respiratory: Positive for cough and shortness of breath. Negative for hemoptysis and sputum production.   Cardiovascular: Positive for chest pain, palpitations, orthopnea and PND. Negative for leg swelling.  Gastrointestinal: Positive for nausea and vomiting.       Since being in CCU  Genitourinary: Negative.   Musculoskeletal: Negative.   Skin: Negative.   Neurological: Negative.   Endo/Heme/Allergies: Negative.   Psychiatric/Behavioral: Negative.    Blood pressure 108/97, pulse 67, temperature 98.2 F (36.8 C), temperature source Oral, resp. rate 29, height 5'  4" (1.626 m), weight 81.8 kg (180 lb 5.4 oz), SpO2 100 %. Physical Exam  Constitutional: He is oriented to person, place, and time. He appears well-developed and well-nourished. No distress.  HENT:  Head: Normocephalic and atraumatic.  Eyes: EOM are normal. Pupils are equal, round, and reactive to light.  Neck: No JVD present.  Cardiovascular: Normal rate and intact distal pulses.   No murmur heard. Irregular rhythm  Respiratory: Effort normal and breath sounds normal. No respiratory distress. He has no rales.  GI: Soft. Bowel sounds are normal. He exhibits no distension  and no mass. There is no tenderness.  Musculoskeletal: He exhibits no edema or tenderness.  Neurological: He is alert and oriented to person, place, and time.  Skin: Skin is warm and dry.  Psychiatric: He has a normal mood and affect.   Peter M Martinique, MD (Primary)      Procedures    IABP Insertion   Left Heart Cath and Coronary Angiography    Conclusion     Ost LM lesion, 95% stenosed.  Ost LAD to Prox LAD lesion, 15% stenosed.  Mid Cx lesion, 40% stenosed.  1st Mrg lesion, 30% stenosed.  Ost RCA lesion, 100% stenosed.  There is mild to moderate left ventricular systolic dysfunction.  1. Critical ostial left main stenosis 95% supplying a large left dominant system.  2. Occluded small nondominant RCA 3. Mild to moderate LV dysfunction with EF 40-45%. 4. Elevated LV EDP 5. Placement of IABP  Plan: patient is currently pain free and hemodynamically stable. Discussed with Dr. Cyndia Bent with CT surgery. He will see the patient with plans for Urgent CABG first thing in the am. Will transfer to ICU and continue hemodynamic support with IV lasix, Ntg, and IABP.      Indications    NSTEMI (non-ST elevated myocardial infarction) ([place]) [I21.4 (ICD-10-CM)]    Technique and Indications    Indication: 69 Yo WM with history of chronic atrial fibrillation ( not on anticoagulation ), HTN, and HL presents  with a NSTEMI. Ecg is concerning for left main disease with ST elevation in AvR and diffuse ST depression. Urgent cardiac cath recommended.  Procedural Details: The right wrist was prepped, draped, and anesthetized with 1% lidocaine. Using the modified Seldinger technique, a 6 French slender sheath was introduced into the right radial artery. 3 mg of verapamil was administered through the sheath, weight-based unfractionated heparin was administered intravenously. Standard Judkins catheters were used for selective coronary angiography and left ventriculography. There was significant dampening of pressure with engagement of the left main with a 5 Fr catheter. The RCA was not directly engaged but appeared occluded at the ostium. Catheter exchanges were performed over an exchange length guidewire. There were no immediate procedural complications. Following the diagnostic procedure the right groin was prepped and draped in a sterile fashion. The right femoral artery was accessed with a modified Seldinger technique and an IABP was placed via this access. The patient was transferred to the post catheterization recovery area for further monitoring.  Contrast: 95 cc Estimated blood loss <50 mL. There were no immediate complications during the procedure.    Coronary Findings    Dominance: Left   Left Main   . Ost LM lesion, 95% stenosed. Moderately Calcified. At left main ostium     Left Anterior Descending   . Ost LAD to Prox LAD lesion, 15% stenosed. Moderately Calcified.     Left Circumflex   . Mid Cx lesion, 40% stenosed. Diffuse.   . First Obtuse [place]   . 1st Mrg lesion, 30% stenosed.     Right Coronary Artery   . Ost RCA lesion, 100% stenosed.       Hemodynamic Support Access site: right femoral artery. An IABP was inserted for hemodynamic support in the setting of cardiogenic shock.    Wall Motion                 Left Heart    Left Ventricle The left  ventricular size is normal. There is mild to moderate left ventricular systolic dysfunction.  The left ventricular ejection fraction is 35-45% by visual estimate. There are wall motion abnormalities in the left ventricle. There are segmental wall motion abnormalities in the left ventricle.    Coronary Diagrams    Diagnostic Diagram            Implants    Name ID Temporary Type Supply   No information to display    PACS Images    Show images for Cardiac catheterization     Link to Procedure Log    Procedure Log      Hemo Data       Most Recent Value   AO Systolic Pressure  160 mmHg   AO Diastolic Pressure  92 mmHg   AO Mean  109 mmHg   LV Systolic Pressure  323 mmHg   LV Diastolic Pressure  24 mmHg   LV EDP  33 mmHg   Arterial Occlusion Pressure Extended Systolic Pressure  557 mmHg   Arterial Occlusion Pressure Extended Diastolic Pressure  93 mmHg   Arterial Occlusion Pressure Extended Mean Pressure  120 mmHg   Left Ventricular Apex Extended Systolic Pressure  322 mmHg   Left Ventricular Apex Extended Diastolic Pressure  25 mmHg   Left Ventricular Apex Extended EDP Pressure  33 mmHg     Assessment/Plan:  He has high grade ostial left main stenosis with left dominant circulation and RCA ostial occlusion presenting with NSTEMI. Plan urgent CABG this am. I discussed the operative procedure with the patient including alternatives, benefits and risks; including but not limited to bleeding, blood transfusion, infection, stroke, myocardial infarction, graft failure, heart block requiring a permanent pacemaker, organ dysfunction, and death.  Rocco Pauls understands and agrees to proceed.  We will schedule surgery for   Gaye Pollack 09/06/2015, 6:35 AM

## 2015-09-15 NOTE — Anesthesia Procedure Notes (Addendum)
Procedure Name: Intubation Date/Time: 08/23/2015 7:44 AM Performed by: Dorie RankQUINN, HOLLY M Pre-anesthesia Checklist: Patient identified, Emergency Drugs available, Suction available, Patient being monitored and Timeout performed Patient Re-evaluated:Patient Re-evaluated prior to inductionOxygen Delivery Method: Circle system utilized Preoxygenation: Pre-oxygenation with 100% oxygen Intubation Type: IV induction, Cricoid Pressure applied and Rapid sequence Ventilation: Mask ventilation without difficulty Laryngoscope Size: Mac and 4 Grade View: Grade I Tube type: Oral Tube size: 8.0 mm Number of attempts: 1 Airway Equipment and Method: Stylet Placement Confirmation: ETT inserted through vocal cords under direct vision,  positive ETCO2 and breath sounds checked- equal and bilateral Secured at: 22 cm Tube secured with: Tape Dental Injury: Teeth and Oropharynx as per pre-operative assessment     Central Venous Catheter Insertion Performed by: anesthesiologist Patient location: OR. Preanesthetic checklist: patient identified, IV checked, site marked, risks and benefits discussed, surgical consent, monitors and equipment checked, pre-op evaluation, timeout performed and anesthesia consent Landmarks identified Catheter size: 8.5 Fr Central line was placed.Sheath introducer Swan type and PA catheter depth:55PA Cath depth:55 Procedure performed using ultrasound guided technique. Attempts: 1 Following insertion, line sutured and dressing applied. Post procedure assessment: blood return through all ports, free fluid flow and no air. Patient tolerated the procedure well with no immediate complications.    Central Venous Catheter Insertion Performed by: anesthesiologist Patient location: OR. Preanesthetic checklist: patient identified, IV checked, site marked, risks and benefits discussed, surgical consent, monitors and equipment checked, pre-op evaluation, timeout performed and anesthesia  consent Landmarks identified PA cath was placed.Swan type and PA catheter depth:thermodilation and 55PA Cath depth:55 Procedure performed using ultrasound guided technique. Attempts: 1 Patient tolerated the procedure well with no immediate complications. Additional procedure comments: Position confirmed with TEE.

## 2015-09-15 NOTE — Progress Notes (Signed)
TELEMETRY: Reviewed telemetry pt in atrial fibrillation with controlled rate 70s: Filed Vitals:   Oct 03, 2015 0300 10/03/15 0400 10-03-2015 0500 2015/10/03 0600  BP: 136/107  97/83 108/97  Pulse: 70 175 63 67  Temp:  98.2 F (36.8 C)    TempSrc:  Oral    Resp: Height:      Weight:      SpO2: 94% 97% 99% 100%    Intake/Output Summary (Last 24 hours) at 10/03/15 0616 Last data filed at 03-Oct-2015 0600  Gross per 24 hour  Intake  75.09 ml  Output   1350 ml  Net -1274.91 ml   Filed Weights   2015-10-03 0007 10-03-2015 0130  Weight: 81.647 kg (180 lb) 81.8 kg (180 lb 5.4 oz)    Subjective Nausea and vomiting during the night. No chest or abdominal pain. No dyspnea. Appears comfortable.   Marland Kitchen aminocaproic acid (AMICAR) for OHS   Intravenous To OR  . aspirin EC  325 mg Oral Daily  . atorvastatin  80 mg Oral q1800  . atropine      . cefUROXime (ZINACEF)  IV  1.5 g Intravenous To OR  . cefUROXime (ZINACEF)  IV  750 mg Intravenous To OR  . chlorhexidine  15 mL Mouth/Throat Once  . Chlorhexidine Gluconate Cloth  6 each Topical Once   And  . Chlorhexidine Gluconate Cloth  6 each Topical Once  . dexmedetomidine  0.1-0.7 mcg/kg/hr Intravenous To OR  . DOPamine  0-10 mcg/kg/min Intravenous To OR  . epinephrine  0-10 mcg/min Intravenous To OR  . furosemide  40 mg Intravenous STAT  . heparin-papaverine-plasmalyte irrigation   Irrigation To OR  . heparin 30,000 units/NS 1000 mL solution for CELLSAVER   Other To OR  . insulin (NOVOLIN-R) infusion   Intravenous To OR  . magnesium sulfate  40 mEq Other To OR  . nitroGLYCERIN  2-200 mcg/min Intravenous To OR  . phenylephrine (NEO-SYNEPHRINE) Adult infusion  30-200 mcg/min Intravenous To OR  . potassium chloride  80 mEq Other To OR  . sodium chloride  3 mL Intravenous Q12H  . sodium chloride  3 mL Intravenous Q12H  . vancomycin  1,250 mg Intravenous To OR   . heparin 1,200 Units/hr (October 03, 2015 0038)  . nitroGLYCERIN 3 mcg/min  (10-03-2015 0400)    LABS: Basic Metabolic Panel:  Recent Labs  16/10/96 2302  NA 140  K 4.1  CL 110  CO2 21*  GLUCOSE 180*  BUN 33*  CREATININE 1.30*  CALCIUM 9.9   Liver Function Tests:  Recent Labs  08/25/2015 2302  AST 29  ALT 32  ALKPHOS 95  BILITOT 0.4  PROT 7.1  ALBUMIN 3.6   No results for input(s): LIPASE, AMYLASE in the last 72 hours. CBC:  Recent Labs  09/12/2015 2302 03-Oct-2015 0145  WBC 11.2* 13.6*  NEUTROABS 8.9*  --   HGB 12.5* 12.5*  HCT 37.9* 37.7*  MCV 90.2 89.8  PLT 216 284   Cardiac Enzymes: No results for input(s): CKTOTAL, CKMB, CKMBINDEX, TROPONINI in the last 72 hours. BNP: No results for input(s): PROBNP in the last 72 hours. D-Dimer: No results for input(s): DDIMER in the last 72 hours. Hemoglobin A1C: No results for input(s): HGBA1C in the last 72 hours. Fasting Lipid Panel: No results for input(s): CHOL, HDL, LDLCALC, TRIG, CHOLHDL, LDLDIRECT in the last 72 hours. Thyroid Function Tests: No results for input(s): TSH, T4TOTAL, T3FREE, THYROIDAB in the last 72 hours.  Invalid input(s):  FREET3   Radiology/Studies:  Dg Chest Port 1 View  09/04/2015  CLINICAL DATA:  Intra-aortic balloon pump placement. Initial encounter. EXAM: PORTABLE CHEST 1 VIEW COMPARISON:  Chest radiograph from December 30, 2014 FINDINGS: The intra-aortic balloon pump tip appears to be within the descending thoracic aorta, 3 cm below the aortic knob. This could be advanced approximately 2-3 cm. Mild vascular congestion is noted. The lungs remain grossly clear. No focal consolidation, pleural effusion or pneumothorax is seen. The cardiomediastinal silhouette is borderline normal in size. No acute osseous abnormalities are identified. IMPRESSION: 1. Intra-aortic balloon pump tip appears to be within the descending thoracic aorta, 3 cm below the aortic knob. This could be advanced approximately 2-3 cm. 2. Mild vascular congestion noted.  Lungs remain grossly clear. These  results were called by telephone at the time of interpretation on 09/10/2015 at 4:24 am to [facility], who verbally acknowledged these results. Electronically Signed   By: Roanna RaiderJeffery  Chang M.D.   On: 09/03/2015 04:25   Dg Chest Port 1 View  December 30, 2014  CLINICAL DATA:  Acute onset of generalized chest pain. Code ST-elevation myocardial infarction. Initial encounter. EXAM: PORTABLE CHEST 1 VIEW COMPARISON:  None. FINDINGS: The lungs are well-aerated. Mild bibasilar opacities may reflect atelectasis or minimal interstitial edema. There is no evidence of pleural effusion or pneumothorax. The cardiomediastinal silhouette is borderline normal in size. No acute osseous abnormalities are seen. IMPRESSION: Mild bibasilar opacities may reflect atelectasis or minimal interstitial edema. Electronically Signed   By: Roanna RaiderJeffery  Chang M.D.   On: December 30, 2014 23:40    PHYSICAL EXAM General: Well developed, well nourished, in no acute distress. Head: Normocephalic, atraumatic, sclera non-icteric, oropharynx is clear Neck: Negative for carotid bruits. JVD not elevated. No adenopathy Lungs: Clear anteriorly. Breathing is unlabored. Heart: RRR S1 S2 without murmurs, rubs, or gallops.  Abdomen: Soft, non-tender, non-distended with normoactive bowel sounds. No hepatomegaly. No rebound/guarding. No obvious abdominal masses. Extremities: No clubbing, cyanosis or edema.  Distal pedal pulses are 2+ and equal bilaterally. Right groin site with IABP without hematoma. Radial site ok as well. Neuro: Alert and oriented X 3. Moves all extremities spontaneously. Psych:  Responds to questions appropriately with a normal affect.  ASSESSMENT AND PLAN: 1. NSTEMI. Critical left main disease. Continue IV Ntg, heparin, IABP. For CABG this am. IABP advance 2 cm based on Xray. 2. Atrial fibrillation-chronic. Rate currently controlled. 3. HTN. BP well controlled. 4. Acute systolic CHF. Good diuresis with IV lasix. I/O negative 1300 cc.  Pulmonary edema improved on CXR. 5. Hyperlipidemia. On statin.  6. Family history of CAD  Present on Admission:  . NSTEMI (non-ST elevated myocardial infarction) (HCC) . Hypertension, uncontrolled . Acute pulmonary edema (HCC) . SOB (shortness of breath) . Atrial fibrillation with RVR (HCC)  Signed, Honor Frison [place], MDFACC 09/05/2015 6:16 AM

## 2015-09-15 NOTE — Anesthesia Preprocedure Evaluation (Signed)
Anesthesia Evaluation  Patient identified by MRN, date of birth, ID band Patient awake    Reviewed: Allergy & Precautions, H&P , NPO status , Patient's Chart, lab work & pertinent test results  History of Anesthesia Complications Negative for: history of anesthetic complications  Airway Mallampati: II  TM Distance: >3 FB Neck ROM: full    Dental no notable dental hx.    Pulmonary shortness of breath, former smoker,    Pulmonary exam normal breath sounds clear to auscultation       Cardiovascular hypertension, Pt. on medications + CAD, + Past MI and +CHF  Normal cardiovascular exam+ dysrhythmias Atrial Fibrillation  Rhythm:regular Rate:Normal  EF 40%, mild to mod depression, on IABP from cath lab   Neuro/Psych Anxiety negative neurological ROS     GI/Hepatic negative GI ROS, Neg liver ROS,   Endo/Other  negative endocrine ROS  Renal/GU negative Renal ROS     Musculoskeletal   Abdominal   Peds  Hematology negative hematology ROS (+)   Anesthesia Other Findings   Reproductive/Obstetrics negative OB ROS                             Anesthesia Physical Anesthesia Plan  ASA: IV and emergent  Anesthesia Plan: General   Post-op Pain Management:    Induction: Intravenous  Airway Management Planned: Oral ETT  Additional Equipment: Arterial line, CVP, TEE, PA Cath and Ultrasound Guidance Line Placement  Intra-op Plan:   Post-operative Plan: Post-operative intubation/ventilation  Informed Consent: I have reviewed the patients History and Physical, chart, labs and discussed the procedure including the risks, benefits and alternatives for the proposed anesthesia with the patient or authorized representative who has indicated his/her understanding and acceptance.   Dental Advisory Given  Plan Discussed with: Anesthesiologist, CRNA and Surgeon  Anesthesia Plan Comments:          Anesthesia Quick Evaluation

## 2015-09-15 NOTE — Brief Op Note (Signed)
22-Jul-2015 - 09/07/2015  12:11 PM  PATIENT:  Georgann Housekeeperonald Bocek  69 y.o. male  PRE-OPERATIVE DIAGNOSIS:  Left main CAD, NSTEMI  POST-OPERATIVE DIAGNOSIS: Left main CAD, NSTEMI  PROCEDURE:   CORONARY ARTERY BYPASS GRAFTING x 3 (LIMA-LAD, SVG-OM1, Free RIMA-OM2) ENDOSCOPIC GREATER SAPHENOUS VEIN HARVEST BILATERAL THIGHS  SURGEON:  Alleen BorneBryan K Bartle, MD  ASSISTANT: Coral CeoGina Shonte Beutler, PA-C  ANESTHESIA:   general  PATIENT CONDITION:  ICU - intubated and hemodynamically stable.  PRE-OPERATIVE WEIGHT: 82 kg

## 2015-09-15 NOTE — Transfer of Care (Signed)
Immediate Anesthesia Transfer of Care Note  Patient: Jason Schroeder  Procedure(s) Performed: Procedure(s): CORONARY ARTERY BYPASS GRAFTING x 3 (LIMA-LAD, SVG-OM1, Free RIMA-OM2) ENDOSCOPIC GREATER SAPHENOUS VEIN HARVEST BILATERAL THIGHS) (N/A) TRANSESOPHAGEAL ECHOCARDIOGRAM (TEE) (N/A)  Patient Location: PACU  Anesthesia Type:General  Level of Consciousness: Patient remains intubated per anesthesia plan  Airway & Oxygen Therapy: Patient remains intubated per anesthesia plan  Post-op Assessment: Post -op Vital signs reviewed and stable  Post vital signs: stable  Last Vitals:  Filed Vitals:   09/09/2015 0700 09/11/2015 1405  BP: 99/88   Pulse: 60 82  Temp:    Resp: 29 16    Complications: No apparent anesthesia complications

## 2015-09-15 NOTE — Progress Notes (Signed)
Pt complaining of Nausea around 0140 gave prn zofran, patient still nauseous, called Fellow Virgina OrganQureshi, orders to get xray and give phenergan, both obtained, patient still nauseous, patient vomited, called Virgina OrganQureshi back orders to give more phenergan, radiologist also called about xray, called fellow Virgina OrganQureshi with results and recommendations from radiologist. Will continue to monitor closely.

## 2015-09-15 NOTE — Progress Notes (Signed)
Chaplain responded to STEMI.  Met patient and with patient's permission, contacted son regarding decision for STEMI.  Son came to the hospital later and chaplain assisted son in finding father's room.  Visited again with patient who appeared tired and somewhat less anxious.  Patient requested a tissue and then asked for his nurse.  Please call as needed for ongoing support.   Luana Shu [phone]     09/20/2015 0115  Clinical Encounter Type  Visited With Patient;Family  Visit Type Initial;Follow-up;Psychological support;Spiritual support  Spiritual Encounters  Spiritual Needs Emotional  Stress Factors  Patient Stress Factors Lack of caregivers  Family Stress Factors Lack of knowledge

## 2015-09-15 NOTE — Progress Notes (Signed)
576fr sheath present in right radial artery.  Sheath unable to aspirate. TR band applied, inflated after sheath removal.  Several kinks noted  Mid sheath.   Band adjusted to 9cc, no hematoma or ecchymosis. Spo2 as measured in right thumb 99%  Band inflated at 19:22:00  , Deflation to start 30 minutes past 19:22:00

## 2015-09-15 NOTE — OR Nursing (Signed)
1st call @1230 : providing patient update 2nd call @ 1252: providing patient update (confirming plans for transfer to 2S Room 6) 3rd call @1350 : confirming plans for patient transport to Room 6

## 2015-09-15 NOTE — Anesthesia Postprocedure Evaluation (Signed)
Anesthesia Post Note  Patient: Jason Schroeder  Procedure(s) Performed: Procedure(s) (LRB): CORONARY ARTERY BYPASS GRAFTING x 3 (LIMA-LAD, SVG-OM1, Free RIMA-OM2) ENDOSCOPIC GREATER SAPHENOUS VEIN HARVEST BILATERAL THIGHS) (N/A) TRANSESOPHAGEAL ECHOCARDIOGRAM (TEE) (N/A)  Patient location during evaluation: SICU Anesthesia Type: General Level of consciousness: sedated Pain management: pain level controlled Vital Signs Assessment: post-procedure vital signs reviewed and stable Respiratory status: patient remains intubated per anesthesia plan Cardiovascular status: stable Anesthetic complications: no Comments: Nitroglycerin stopped on arrival, phenylephrine low dose started, dopamine continued at 2.. Responds well to volume, stable, balloon pump at 1:3 augmentation currently    Last Vitals:  Filed Vitals:   09/13/2015 0700 09/10/2015 1405  BP: 99/88   Pulse: 60 82  Temp:    Resp: 29 16    Last Pain:  Filed Vitals:   09/16/2015 1406  PainSc: 4                  Reino KentJudd, Lynley Killilea J

## 2015-09-15 NOTE — Progress Notes (Signed)
Utilization Review Completed.Carrye Goller T12/26/2016  

## 2015-09-15 NOTE — Progress Notes (Signed)
Attempted parameters as patient was on CPAP/PS of 5/5. Patient's rate remained from 32 to 42 throughout CPAP wean. Patient attempted NIF three times and best out of three attempts was -12. Patient placed back on full support. Patient is tolerating well and RT will continue to monitor.

## 2015-09-15 NOTE — Op Note (Signed)
CARDIOVASCULAR SURGERY OPERATIVE NOTE  January 09, 2015  Surgeon:  Alleen BorneBryan K. Bartle, MD  First Assistant: Coral CeoGina Collins, PA-C   Preoperative Diagnosis:  Severe ostial left main coronary artery disease and RCA ostial occlusion   Postoperative Diagnosis:  Same   Procedure:  1. Median Sternotomy 2. Extracorporeal circulation 3.   Coronary artery bypass grafting x 3   Left internal mammary graft to the LAD  Free right internal mammary graft to the OM2  SVG to OM1  4.   Endoscopic vein harvest from both thighs   Anesthesia:  General Endotracheal   Clinical History/Surgical Indication:  The patient is a 69 year old non-smoker with a history of hyperlipidemia, hypertension and rate controlled atrial fibrillation not on anticoagulation who presented overnight with a week or so history of shortness of breath and intermittent chest pain. Last night he developed severe chest pain that did not resolved associated with shortness of breath and called EMS. His CXR on arrival showed pulmonary edema and initial POC troponin was 0.54. ECG showed diffuse ST depression. Cath shows 95% ostial LM stenosis with 55F catheter dampening. The LCX is dominant and has 40% mid vessel stenosis. The RCA is occluded at the ostium and the distal vessel not seen. LVEF is 40-45% with an LVEDP of 33. He had an IABP inserted for anatomy and was taken to the CCU when he was diuresed over the past several hrs. His pulmonary edema looks improved this am. He has had no chest pain overnight but has been nauseous and vomited. Marland Kitchen.  He has high grade ostial left main stenosis with left dominant circulation and RCA ostial occlusion presenting with NSTEMI. Plan urgent CABG this am. I discussed the operative procedure with the patient including alternatives, benefits and risks; including but not limited to bleeding, blood transfusion, infection,  stroke, myocardial infarction, graft failure, heart block requiring a permanent pacemaker, organ dysfunction, and death. Georgann Housekeeperonald Wassel understands and agrees to proceed.  Preparation:  The patient was taken directly to the OR with the IABP in place.  Preoperative antibiotics were given. A pulmonary arterial line and radial arterial line were placed by the anesthesia team. After being placed under general endotracheal anesthesia by the anesthesia team the neck, chest, abdomen, and both legs were prepped with betadine soap and solution and draped in the usual sterile manner. A surgical time-out was taken and the correct patient and operative procedure were confirmed with the nursing and anesthesia staff.   TEE: performed by Dr. Karlyne GreenspanBenjamin Judd. This showed mild LV dysfunction with anteroseptal hypokinesis. There was no significant MR and no AS or AI. There was mild to moderate TR. RV function looked normal.  Cardiopulmonary Bypass:  A median sternotomy was performed. The pericardium was opened in the midline. Right ventricular function appeared normal. The ascending aorta was of normal size and had no palpable plaque. There were no contraindications to aortic cannulation or cross-clamping. The patient was fully systemically heparinized and the ACT was maintained > 400 sec. The proximal aortic arch was cannulated with a 20 F aortic cannula for arterial inflow. Venous cannulation was performed via the right atrial appendage using a two-staged venous cannula. An antegrade cardioplegia/vent cannula was inserted into the mid-ascending aorta. Aortic occlusion was performed with a single cross-clamp. Systemic cooling to 34 degrees Centigrade and topical cooling of the heart with iced saline were used. Hyperkalemic antegrade cold blood cardioplegia was used to induce diastolic arrest and was then given at about 20 minute intervals throughout the period  of arrest to maintain myocardial temperature at or below 10  degrees centigrade. A temperature probe was inserted into the interventricular septum and an insulating pad was placed in the pericardium.   Left internal mammary harvest:  The left side of the sternum was retracted using the Rultract retractor. The left internal mammary artery was harvested as a pedicle graft. All side branches were clipped. It was a medium-sized vessel of good quality with excellent blood flow. It was ligated distally and divided. It was sprayed with topical papaverine solution to prevent vasospasm.  Right internal mammary harvest:  The right side of the sternum was retracted using the Rultract retractor. The right internal mammary artery was harvested as a pedicle graft. All side branches were clipped. It was a medium-sized vessel of good quality but had poor blood flow and a weak pulse in it. It was carefully probed with a 1.5 mm probe and there was no obstruction to the proximal portion of the vessel. There was still poor blood flow so I decided to divide it proximally and examine it there to determine if there was some proximal RIMA stenosis or subclavian stenosis or ostial RIMA stenosis.   It was ligated proximally with a 2-0 silk suture ligature and and divided. The proximal end of the RIMA was of good size and had no obstruction. The vessel was carefully cannulated with a 20 gauge IV catheter and it flushed easily without apparent obstruction. I decided to use it as a free graft. It was sprayed with topical papaverine solution to prevent vasospasm.   Endoscopic vein harvest:  The right greater saphenous vein was harvested endoscopically through a 2 cm incision medial to the right knee. It was harvested from the upper thigh to below the knee. It was a small-sized vein of poor quality. The side branches were all ligated with 4-0 silk ties. I did not feel that it was suitable to use. Therefore the left GSV was harvested from the thigh endoscopically. There was about one length  that was suitable and the remainder was small.    Coronary arteries:  The coronary arteries were examined.   LAD:  Large vessel with no distal disease  LCX:  OM1 large with no distal disease. OM2 large with no distal disease  RCA:  Non-dominant. There was a small PDA that was a thread with no visible lumen.   Grafts:  1. LIMA to the LAD: 2.5 mm. It was sewn end to side using 8-0 prolene continuous suture. 2. Free RIMA to OM2:  2.0 mm. It was sewn end to side using 8-0 prolene continuous suture. 3. SVG to OM1:  2.0 mm. It was sewn end to side using 7-0 prolene continuous suture.   The proximal vein graft anastomosis was performed to the mid-ascending aorta using continuous 6-0 prolene suture. The proximal end of the RIMA graft was anastomosed to the proximal to mid-portion of vein graft in an end to side manner using continuous 7-0 prolene suture. A graft marker was placed around the proximal vein graft anastomosis.   Completion:  The patient was rewarmed to 37 degrees Centigrade. The clamp was removed from the LIMA pedicle and there was rapid warming of the septum and return of ventricular fibrillation. The crossclamp was removed with a time of 72 minutes. There was spontaneous return of sinus rhythm. The distal and proximal anastomoses were checked for hemostasis. The position of the grafts was satisfactory. Two temporary epicardial pacing wires were placed on the right atrium  and two on the right ventricle. The patient was weaned from CPB without difficulty on dopamine 3 mcg. CPB time was 102 minutes. Cardiac output was 4 LPM. Heparin was fully reversed with protamine and the aortic and venous cannulas removed. Hemostasis was achieved. Mediastinal and left pleural drainage tubes were placed. The sternum was closed with double #6 stainless steel wires. The fascia was closed with continuous # 1 vicryl suture. The subcutaneous tissue was closed with 2-0 vicryl continuous suture. The skin  was closed with 3-0 vicryl subcuticular suture. All sponge, needle, and instrument counts were reported correct at the end of the case. Dry sterile dressings were placed over the incisions and around the chest tubes which were connected to pleurevac suction. The patient was then transported to the surgical intensive care unit in critical but stable condition.

## 2015-09-16 ENCOUNTER — Encounter (HOSPITAL_COMMUNITY): Payer: Self-pay | Admitting: Cardiology

## 2015-09-16 ENCOUNTER — Inpatient Hospital Stay (HOSPITAL_COMMUNITY): Payer: Medicare HMO

## 2015-09-16 DIAGNOSIS — Z951 Presence of aortocoronary bypass graft: Secondary | ICD-10-CM

## 2015-09-16 LAB — CBC
HCT: 29.9 % — ABNORMAL LOW (ref 39.0–52.0)
HEMATOCRIT: 31.8 % — AB (ref 39.0–52.0)
HEMOGLOBIN: 10.6 g/dL — AB (ref 13.0–17.0)
HEMOGLOBIN: 9.6 g/dL — AB (ref 13.0–17.0)
MCH: 30.7 pg (ref 26.0–34.0)
MCH: 30 pg (ref 26.0–34.0)
MCHC: 33.3 g/dL (ref 30.0–36.0)
MCHC: 32.1 g/dL (ref 30.0–36.0)
MCV: 92.2 fL (ref 78.0–100.0)
MCV: 93.4 fL (ref 78.0–100.0)
PLATELETS: 143 10*3/uL — AB (ref 150–400)
Platelets: 153 10*3/uL (ref 150–400)
RBC: 3.45 MIL/uL — ABNORMAL LOW (ref 4.22–5.81)
RBC: 3.2 MIL/uL — AB (ref 4.22–5.81)
RDW: 14.4 % (ref 11.5–15.5)
RDW: 14.7 % (ref 11.5–15.5)
WBC: 16.6 10*3/uL — ABNORMAL HIGH (ref 4.0–10.5)
WBC: 16.9 10*3/uL — AB (ref 4.0–10.5)

## 2015-09-16 LAB — BASIC METABOLIC PANEL
ANION GAP: 10 (ref 5–15)
BUN: 38 mg/dL — ABNORMAL HIGH (ref 6–20)
CHLORIDE: 110 mmol/L (ref 101–111)
CO2: 20 mmol/L — AB (ref 22–32)
Calcium: 8.1 mg/dL — ABNORMAL LOW (ref 8.9–10.3)
Creatinine, Ser: 2.45 mg/dL — ABNORMAL HIGH (ref 0.61–1.24)
GFR, EST AFRICAN AMERICAN: 30 mL/min — AB (ref >60)
GFR, EST NON AFRICAN AMERICAN: 26 mL/min — AB (ref >60)
Glucose, Bld: 138 mg/dL — ABNORMAL HIGH (ref 65–99)
Potassium: 4.2 mmol/L (ref 3.5–5.1)
Sodium: 140 mmol/L (ref 135–145)

## 2015-09-16 LAB — POCT I-STAT, CHEM 8
BUN: 42 mg/dL — ABNORMAL HIGH (ref 6–20)
CALCIUM ION: 1.06 mmol/L — AB (ref 1.13–1.30)
Chloride: 104 mmol/L (ref 101–111)
Creatinine, Ser: 3.4 mg/dL — ABNORMAL HIGH (ref 0.61–1.24)
Glucose, Bld: 149 mg/dL — ABNORMAL HIGH (ref 65–99)
HEMATOCRIT: 28 % — AB (ref 39.0–52.0)
HEMOGLOBIN: 9.5 g/dL — AB (ref 13.0–17.0)
Potassium: 4.2 mmol/L (ref 3.5–5.1)
SODIUM: 140 mmol/L (ref 135–145)
TCO2: 18 mmol/L (ref 0–100)

## 2015-09-16 LAB — POCT I-STAT 3, ART BLOOD GAS (G3+)
Acid-base deficit: 5 mmol/L — ABNORMAL HIGH (ref 0.0–2.0)
Acid-base deficit: 6 mmol/L — ABNORMAL HIGH (ref 0.0–2.0)
BICARBONATE: 19.7 meq/L — AB (ref 20.0–24.0)
Bicarbonate: 18.7 mEq/L — ABNORMAL LOW (ref 20.0–24.0)
O2 SAT: 97 %
O2 SAT: 93 %
PCO2 ART: 34.6 mmHg — AB (ref 35.0–45.0)
PH ART: 7.349 — AB (ref 7.350–7.450)
PO2 ART: 75 mmHg — AB (ref 80.0–100.0)
Patient temperature: 38.4
TCO2: 21 mmol/L (ref 0–100)
TCO2: 20 mmol/L (ref 0–100)
pCO2 arterial: 37.1 mmHg (ref 35.0–45.0)
pH, Arterial: 7.34 — ABNORMAL LOW (ref 7.350–7.450)
pO2, Arterial: 99 mmHg (ref 80.0–100.0)

## 2015-09-16 LAB — GLUCOSE, CAPILLARY
GLUCOSE-CAPILLARY: 90 mg/dL (ref 65–99)
GLUCOSE-CAPILLARY: 120 mg/dL — AB (ref 65–99)
Glucose-Capillary: 104 mg/dL — ABNORMAL HIGH (ref 65–99)
Glucose-Capillary: 105 mg/dL — ABNORMAL HIGH (ref 65–99)
Glucose-Capillary: 121 mg/dL — ABNORMAL HIGH (ref 65–99)
Glucose-Capillary: 124 mg/dL — ABNORMAL HIGH (ref 65–99)
Glucose-Capillary: 126 mg/dL — ABNORMAL HIGH (ref 65–99)
Glucose-Capillary: 85 mg/dL (ref 65–99)

## 2015-09-16 LAB — HEMOGLOBIN A1C
HEMOGLOBIN A1C: 6 % — AB (ref 4.8–5.6)
MEAN PLASMA GLUCOSE: 126 mg/dL

## 2015-09-16 LAB — CREATININE, SERUM
Creatinine, Ser: 3.59 mg/dL — ABNORMAL HIGH (ref 0.61–1.24)
GFR calc Af Amer: 19 mL/min — ABNORMAL LOW (ref >60)
GFR calc non Af Amer: 16 mL/min — ABNORMAL LOW (ref >60)

## 2015-09-16 LAB — MAGNESIUM
MAGNESIUM: 2.9 mg/dL — AB (ref 1.7–2.4)
Magnesium: 2.6 mg/dL — ABNORMAL HIGH (ref 1.7–2.4)

## 2015-09-16 MED ORDER — INSULIN ASPART 100 UNIT/ML ~~LOC~~ SOLN
0.0000 [IU] | SUBCUTANEOUS | Status: AC
  Administered 2015-09-16: 2 [IU] via SUBCUTANEOUS

## 2015-09-16 MED ORDER — ALBUMIN HUMAN 5 % IV SOLN
12.5000 g | Freq: Once | INTRAVENOUS | Status: AC
  Administered 2015-09-16: 12.5 g via INTRAVENOUS
  Filled 2015-09-16: qty 250

## 2015-09-16 MED ORDER — CETYLPYRIDINIUM CHLORIDE 0.05 % MT LIQD
7.0000 mL | Freq: Two times a day (BID) | OROMUCOSAL | Status: DC
Start: 2015-09-16 — End: 2015-09-18
  Administered 2015-09-16 – 2015-09-18 (×4): 7 mL via OROMUCOSAL

## 2015-09-16 MED ORDER — INSULIN ASPART 100 UNIT/ML ~~LOC~~ SOLN
0.0000 [IU] | SUBCUTANEOUS | Status: DC
  Administered 2015-09-16 – 2015-09-18 (×4): 2 [IU] via SUBCUTANEOUS

## 2015-09-16 MED ORDER — DILTIAZEM HCL 100 MG IV SOLR
5.0000 mg/h | INTRAVENOUS | Status: DC
  Administered 2015-09-16: 5 mg/h via INTRAVENOUS
  Administered 2015-09-17: 7.5 mg/h via INTRAVENOUS
  Filled 2015-09-16 (×2): qty 100

## 2015-09-16 MED ORDER — INFLUENZA VAC SPLIT QUAD 0.5 ML IM SUSY
0.5000 mL | PREFILLED_SYRINGE | INTRAMUSCULAR | Status: DC | PRN
Start: 2015-09-16 — End: 2015-09-18

## 2015-09-16 MED ORDER — FUROSEMIDE 10 MG/ML IJ SOLN
80.0000 mg | Freq: Once | INTRAMUSCULAR | Status: AC
  Administered 2015-09-16: 80 mg via INTRAVENOUS
  Filled 2015-09-16: qty 8

## 2015-09-16 MED FILL — Lidocaine HCl Local Preservative Free (PF) Inj 1%: INTRAMUSCULAR | Qty: 30 | Status: AC

## 2015-09-16 MED FILL — Nitroglycerin IV Soln 100 MCG/ML in D5W: INTRA_ARTERIAL | Qty: 10 | Status: AC

## 2015-09-16 MED FILL — Heparin Sodium (Porcine) 2 Unit/ML in Sodium Chloride 0.9%: INTRAMUSCULAR | Qty: 500 | Status: AC

## 2015-09-16 MED FILL — Verapamil HCl IV Soln 2.5 MG/ML: INTRAVENOUS | Qty: 2 | Status: AC

## 2015-09-16 NOTE — Progress Notes (Signed)
Site area: RFA  IABP catheter and sheath Site Prior to Removal:  Level 0 Pressure Applied For:25 min Manual:   yes Patient Status During Pull:  stable Post Pull Site:  Level 0 Post Pull Instructions Given:  yes Post Pull Pulses Present: palpable rt PT Dressing Applied:  clear Bedrest begins @ 0845 Comments:

## 2015-09-16 NOTE — Procedures (Signed)
Extubation Procedure Note  Patient Details:   Name: Jason Schroeder DOB: 12/04/1945 MRN: [id number]   Airway Documentation:     Evaluation  O2 sats: stable throughout Complications: No apparent complications Patient did tolerate procedure well. Bilateral Breath Sounds: Clear, Diminished Suctioning: Airway Yes  PT was extubated to a 3L [place]  PT was able to speak  Sats are stable  Aleli Navedo, Duane LopeJeffrey D 09/16/2015, 7:24 AM

## 2015-09-16 NOTE — Progress Notes (Signed)
      [address]       [place] [id number]             [phone]      Extubated earlier today  BP 88/60 mmHg  Pulse 121  Temp(Src) 97.8 F (36.6 C) (Oral)  Resp 40  Ht 5\' 4"  (1.626 m)  Wt 207 lb 3.7 oz (94 kg)  BMI 35.55 kg/m2  SpO2 97%   Intake/Output Summary (Last 24 hours) at 09/16/15 1643 Last data filed at 09/16/15 1600  Gross per 24 hour  Intake 2685.01 ml  Output   1730 ml  Net 955.01 ml   Amiodarone stopped earlier. Currently in atrial fib with rate in 120-130 range. He is on dopamine.   Will start beta blockers when BP allows. Will start a diltiazem drip without a bolus to see if we can get his rate down a little  Viviann SpareSteven C. Dorris FetchHendrickson, MD [facility] [phone]

## 2015-09-16 NOTE — Plan of Care (Signed)
Cath lab called for IABP removal

## 2015-09-16 NOTE — Progress Notes (Signed)
Resp Therapist extubated pt at 0725 per Dr. Laneta SimmersBartle, who was at bedside. Pt tolerated well, oral suction and mouth care done post-extubation and pt placed on 3L oxygen via nasal cannula. Currently at 93% O2sat, pt resting. Will continue to monitor.   Peyton Bottomsachel R Toriann Spadoni, RN 8:00 AM 09/16/2015

## 2015-09-16 NOTE — Progress Notes (Signed)
1 Day Post-Op Procedure(s) (LRB): CORONARY ARTERY BYPASS GRAFTING x 3 (LIMA-LAD, SVG-OM1, Free RIMA-OM2) ENDOSCOPIC GREATER SAPHENOUS VEIN HARVEST BILATERAL THIGHS) (N/A) TRANSESOPHAGEAL ECHOCARDIOGRAM (TEE) (N/A) Subjective:  Intubated but alert and following commands. Not extubated overnight because he could not do parameters according to nurse.  Objective: Vital signs in last 24 hours: Temp:  [98.6 F (37 C)-101.8 F (38.8 C)] 101.1 F (38.4 C) (12/27 0700) Pulse Rate:  [32-91] 37 (12/27 0700) Cardiac Rhythm: atrial fibrillation 100 Resp:  [12-31] 25 (12/27 0700) BP: (68-129)/(48-85) 117/71 mmHg (12/27 0700) SpO2:  [97 %-100 %] 97 % (12/27 0700) Arterial Line BP: (65-119)/(44-77) 107/69 mmHg (12/27 0600) FiO2 (%):  [40 %-50 %] 40 % (12/27 0700) Weight:  [94 kg (207 lb 3.7 oz)] 94 kg (207 lb 3.7 oz) (12/27 0500)  Hemodynamic parameters for last 24 hours: PAP: (13-51)/(6-38) 36/27 mmHg CO:  [2.5 L/min-4.1 L/min] 3.4 L/min CI:  [1.3 L/min/m2-2.2 L/min/m2] 1.8 L/min/m2  Intake/Output from previous day: 12/26 0701 - 12/27 0700 In: 6224.6 [I.V.:4124.6; Blood:500; NG/GT:150; IV Piggyback:1450] Out: 2775 [Urine:1985; Emesis/NG output:300; Chest Tube:490] Intake/Output this shift:    General appearance: alert and cooperative Neurologic: intact Heart: irregular rate and rhythm, S1, S2 normal, no murmur, click, rub or gallop Lungs: clear to auscultation bilaterally Extremities: edema mild Wound: dressings dry  Lab Results:  Recent Labs  09/09/2015 2023 09/16/15 0435  WBC 12.7* 16.6*  HGB 9.9* 10.6*  HCT 30.6* 31.8*  PLT 145* 153   BMET:  Recent Labs  2015-10-14 2302  09/10/2015 2012 09/13/2015 2023 09/16/15 0435  NA 140  < > 141  --  140  K 4.1  < > 4.3  --  4.2  CL 110  < > 107  --  110  CO2 21*  --   --   --  20*  GLUCOSE 180*  < > 161*  --  138*  BUN 33*  < > 33*  --  38*  CREATININE 1.30*  < > 1.90* 1.99* 2.45*  CALCIUM 9.9  --   --   --  8.1*  < > = values in  this interval not displayed.  PT/INR:  Recent Labs  09/13/2015 1410  LABPROT 17.8*  INR 1.46   ABG    Component Value Date/Time   PHART 7.340* 09/16/2015 0439   HCO3 19.7* 09/16/2015 0439   TCO2 21 09/16/2015 0439   ACIDBASEDEF 5.0* 09/16/2015 0439   O2SAT 97.0 09/16/2015 0439   CBG (last 3)   Recent Labs  09/16/15 0003 09/16/15 0100 09/16/15 0400  GLUCAP 85 90 104*   CXR ok  Assessment/Plan: S/P Procedure(s) (LRB): CORONARY ARTERY BYPASS GRAFTING x 3 (LIMA-LAD, SVG-OM1, Free RIMA-OM2) ENDOSCOPIC GREATER SAPHENOUS VEIN HARVEST BILATERAL THIGHS) (N/A) TRANSESOPHAGEAL ECHOCARDIOGRAM (TEE) (N/A)  Preop EF 40-45% s/p NSTEMI  He has been hemodynamically stable overnight on dop 4, neo, IABP. Will DC IABP this am.  I think he is ready for extubation this am.  Chronic atrial fibrillation: he was in sinus initially postop and I started amio to see if it would keep him in sinus but this am he is back in atrial fib so I think amio can be stopped. He was rate-controlled previously on no meds. Will plan to start some lopressor once off neo and dopamine. He was not on anticoagulation preop and according to his family he did not even want to take aspirin. He will need anticoagulation, could evaluate for watchman if he refuses anticoagulation. According to his son he lives  alone and may not take anticoagulation.  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 Urine output ok so this should improve with time. Will continue low dose dopamine today.  Remove chest tubes later today.  Diabetes: no prior diagnosis but Hgb A1c 6.0 on admission. Continue SSI. Will need education.     LOS: 1 day    Alleen BorneBryan K Abid Bolla 09/16/2015

## 2015-09-16 NOTE — Progress Notes (Signed)
Cardiologist: Dr. [place] Subjective:  Give me some ice". No CP, no SOB  Objective:  Vital Signs in the last 24 hours: Temp:  [98.6 F (37 C)-101.8 F (38.8 C)] 101.1 F (38.4 C) (12/27 0945) Pulse Rate:  [30-112] 112 (12/27 0945) Resp:  [12-42] 35 (12/27 0945) BP: (68-129)/(48-85) 94/58 mmHg (12/27 0930) SpO2:  [89 %-100 %] 90 % (12/27 0945) Arterial Line BP: (65-119)/(44-77) 85/59 mmHg (12/27 0945) FiO2 (%):  [40 %-50 %] 40 % (12/27 0700) Weight:  [207 lb 3.7 oz (94 kg)] 207 lb 3.7 oz (94 kg) (12/27 0500)  Intake/Output from previous day: 12/26 0701 - 12/27 0700 In: 6227.7 [I.V.:4127.7; Blood:500; NG/GT:150; IV Piggyback:1450] Out: 2775 [Urine:1985; Emesis/NG output:300; Chest Tube:490]   Physical Exam: General: Well developed, well nourished, in no acute distress. Head:  Normocephalic and atraumatic. Swan Lungs: Clear to auscultation and percussion. Heart: Irreg irreg.  No murmur, rubs or gallops. Scar dressed Abdomen: soft, non-tender, positive bowel sounds. Extremities: No clubbing or cyanosis. No edema. Neurologic: Alert and oriented x 3.    Lab Results:  Recent Labs  09/06/2015 2023 09/16/15 0435  WBC 12.7* 16.6*  HGB 9.9* 10.6*  PLT 145* 153    Recent Labs  10/05/15 2302  08/27/2015 2012 09/07/2015 2023 09/16/15 0435  NA 140  < > 141  --  140  K 4.1  < > 4.3  --  4.2  CL 110  < > 107  --  110  CO2 21*  --   --   --  20*  GLUCOSE 180*  < > 161*  --  138*  BUN 33*  < > 33*  --  38*  CREATININE 1.30*  < > 1.90* 1.99* 2.45*  < > = values in this interval not displayed. No results for input(s): TROPONINI in the last 72 hours.  Invalid input(s): CK, MB Hepatic Function Panel  Recent Labs  10/05/15 2302  PROT 7.1  ALBUMIN 3.6  AST 29  ALT 32  ALKPHOS 95  BILITOT 0.4   No results for input(s): CHOL in the last 72 hours. No results for input(s): PROTIME in the last 72 hours.  Imaging: Dg Chest Port 1 View  09/16/2015  CLINICAL DATA:  Hypoxia  EXAM: PORTABLE CHEST 1 VIEW COMPARISON:  September 15, 2015 FINDINGS: Endotracheal tube tip is 4.8 cm above the carina. Swan-Ganz catheter tip is in the proximal right main pulmonary artery. Nasogastric tube tip and side port are below the diaphragm. There is a mediastinal drain as well as 2 chest tubes on the left. Intra-aortic balloon pump tip is just below the aortic arch. Temporary pacemaker wires are attached to the right heart. No pneumothorax. There is mild atelectasis in the right base. Lungs elsewhere clear. Heart is borderline prominent with pulmonary vascularity within normal limits. No adenopathy. IMPRESSION: Tube and catheter positions as described without pneumothorax. Mild atelectasis right base. Lungs elsewhere clear. No change in cardiac silhouette. Electronically Signed   By: Bretta Bang III M.D.   On: 09/16/2015 07:36   Dg Chest Port 1 View  09/12/2015  CLINICAL DATA:  69 year old male status post 3 vessel CABG EXAM: PORTABLE CHEST 1 VIEW COMPARISON:  Prior chest x-ray 09/17/2015 FINDINGS: The patient is intubated. The tip of the endotracheal tube is 3.7 cm above the carina. A nasogastric tube is present. The tip is not identified but lies below the diaphragm likely within the stomach. Mediastinal and bilateral pleural drains are present via a subxiphoid approach. A  right IJ vascular sheath conveys a Swan-Ganz catheter into the heart. The tip of the Swan-Ganz catheter overlies the proximal right lower lobe pulmonary artery. Patient is status post median sternotomy with evidence of multivessel CABG including LIMA bypass. Inspiratory volumes are low. There is mild bibasilar atelectasis. No pulmonary edema, large pleural effusion or pneumothorax. IMPRESSION: 1. Support apparatus in satisfactory position as detailed above. 2. Low inspiratory volumes with mild bibasilar atelectasis. No pneumothorax, pleural effusion or pulmonary edema. Electronically Signed   By: Malachy Moan M.D.   On:  2015-09-16 14:36   Dg Chest Port 1 View  2015/09/16  CLINICAL DATA:  Intra-aortic balloon pump placement. Initial encounter. EXAM: PORTABLE CHEST 1 VIEW COMPARISON:  Chest radiograph from 09/17/2015 FINDINGS: The intra-aortic balloon pump tip appears to be within the descending thoracic aorta, 3 cm below the aortic knob. This could be advanced approximately 2-3 cm. Mild vascular congestion is noted. The lungs remain grossly clear. No focal consolidation, pleural effusion or pneumothorax is seen. The cardiomediastinal silhouette is borderline normal in size. No acute osseous abnormalities are identified. IMPRESSION: 1. Intra-aortic balloon pump tip appears to be within the descending thoracic aorta, 3 cm below the aortic knob. This could be advanced approximately 2-3 cm. 2. Mild vascular congestion noted.  Lungs remain grossly clear. These results were called by telephone at the time of interpretation on 2015/09/16 at 4:24 am to [facility] RN on [facility], who verbally acknowledged these results. Electronically Signed   By: Roanna Raider M.D.   On: Sep 16, 2015 04:25   Dg Chest Port 1 View  09/08/2015  CLINICAL DATA:  Acute onset of generalized chest pain. Code ST-elevation myocardial infarction. Initial encounter. EXAM: PORTABLE CHEST 1 VIEW COMPARISON:  None. FINDINGS: The lungs are well-aerated. Mild bibasilar opacities may reflect atelectasis or minimal interstitial edema. There is no evidence of pleural effusion or pneumothorax. The cardiomediastinal silhouette is borderline normal in size. No acute osseous abnormalities are seen. IMPRESSION: Mild bibasilar opacities may reflect atelectasis or minimal interstitial edema. Electronically Signed   By: Roanna Raider M.D.   On: 09/10/2015 23:40   Personally viewed.   Telemetry: AFIB 100 Personally viewed.   Cardiac Studies:  TEE 40% EF  Meds: Scheduled Meds: . acetaminophen  1,000 mg Oral 4 times per day   Or  . acetaminophen (TYLENOL) oral liquid 160 mg/5  mL  1,000 mg Per Tube 4 times per day  . antiseptic oral rinse  7 mL Mouth Rinse BID  . antiseptic oral rinse  7 mL Mouth Rinse QID  . aspirin EC  325 mg Oral Daily   Or  . aspirin  324 mg Per Tube Daily  . atorvastatin  80 mg Oral q1800  . bisacodyl  10 mg Oral Daily   Or  . bisacodyl  10 mg Rectal Daily  . cefUROXime (ZINACEF)  IV  1.5 g Intravenous Q12H  . chlorhexidine gluconate  15 mL Mouth Rinse BID  . docusate sodium  200 mg Oral Daily  . insulin aspart  0-24 Units Subcutaneous 6 times per day  . insulin aspart  0-24 Units Subcutaneous 6 times per day  . [START ON 09/17/2015] pantoprazole  40 mg Oral Daily  . sodium chloride  3 mL Intravenous Q12H   Continuous Infusions: . sodium chloride 20 mL/hr at 09/16/15 0900  . sodium chloride    . sodium chloride 10 mL/hr at 09/16/15 0900  . DOPamine 3 mcg/kg/min (09/16/15 0900)  . lactated ringers    .  lactated ringers 20 mL/hr at 09/16/15 0900  . nitroGLYCERIN Stopped (08/28/2015 1400)  . phenylephrine (NEO-SYNEPHRINE) Adult infusion 30 mcg/min (09/16/15 0918)   PRN Meds:.sodium chloride, albumin human, lactated ringers, morphine injection, ondansetron (ZOFRAN) IV, oxyCODONE, sodium chloride, traMADol  Assessment/Plan:  Principal Problem:   NSTEMI (non-ST elevated myocardial infarction) (HCC) Active Problems:   Hypertension, uncontrolled   Acute pulmonary edema (HCC)   SOB (shortness of breath)   Atrial fibrillation with RVR (HCC)   Ischemic chest pain (HCC)   Acute systolic CHF (congestive heart failure) (HCC)   S/P CABG x 3   AFIB  - chronic prior to CABG  - transient post op NSR (amio now stopped) agree  - continue rate control  - recommend anticoagulation when able (saw Dr. Laneta SimmersBartle thought about Watchman potentially in future)  - CHADS-VASc at least 2 (age, HTN), 3 if including CAD.   CAD/NSTEMI  - Consider DAPT (Plavix) with ASA 81 when able  - Bb when able  - post CABG  - LM disease  Fever post op  CKD 3  with acute kidney injury  - Creat from 1.3 to 2.4  - monitor  Lehua Flores 09/16/2015, 9:53 AM

## 2015-09-17 ENCOUNTER — Encounter (HOSPITAL_COMMUNITY): Payer: Self-pay | Admitting: Certified Registered Nurse Anesthetist

## 2015-09-17 ENCOUNTER — Inpatient Hospital Stay (HOSPITAL_COMMUNITY): Payer: Medicare HMO

## 2015-09-17 ENCOUNTER — Inpatient Hospital Stay (HOSPITAL_COMMUNITY): Payer: Medicare HMO | Admitting: Certified Registered Nurse Anesthetist

## 2015-09-17 DIAGNOSIS — N179 Acute kidney failure, unspecified: Secondary | ICD-10-CM

## 2015-09-17 DIAGNOSIS — Z951 Presence of aortocoronary bypass graft: Secondary | ICD-10-CM | POA: Insufficient documentation

## 2015-09-17 DIAGNOSIS — E872 Acidosis: Secondary | ICD-10-CM

## 2015-09-17 DIAGNOSIS — J9601 Acute respiratory failure with hypoxia: Secondary | ICD-10-CM

## 2015-09-17 DIAGNOSIS — Z452 Encounter for adjustment and management of vascular access device: Secondary | ICD-10-CM | POA: Insufficient documentation

## 2015-09-17 DIAGNOSIS — R579 Shock, unspecified: Secondary | ICD-10-CM

## 2015-09-17 DIAGNOSIS — I319 Disease of pericardium, unspecified: Secondary | ICD-10-CM

## 2015-09-17 LAB — BASIC METABOLIC PANEL
ANION GAP: 16 — AB (ref 5–15)
ANION GAP: 19 — AB (ref 5–15)
Anion gap: 24 — ABNORMAL HIGH (ref 5–15)
BUN: 54 mg/dL — ABNORMAL HIGH (ref 6–20)
BUN: 52 mg/dL — AB (ref 6–20)
BUN: 50 mg/dL — ABNORMAL HIGH (ref 6–20)
CALCIUM: 8.7 mg/dL — AB (ref 8.9–10.3)
CALCIUM: 7.9 mg/dL — AB (ref 8.9–10.3)
CHLORIDE: 104 mmol/L (ref 101–111)
CO2: 16 mmol/L — ABNORMAL LOW (ref 22–32)
CO2: 13 mmol/L — AB (ref 22–32)
CO2: 18 mmol/L — ABNORMAL LOW (ref 22–32)
CREATININE: 5.03 mg/dL — AB (ref 0.61–1.24)
Calcium: 7.8 mg/dL — ABNORMAL LOW (ref 8.9–10.3)
Chloride: 105 mmol/L (ref 101–111)
Chloride: 105 mmol/L (ref 101–111)
Creatinine, Ser: 4.44 mg/dL — ABNORMAL HIGH (ref 0.61–1.24)
Creatinine, Ser: 4.77 mg/dL — ABNORMAL HIGH (ref 0.61–1.24)
GFR calc Af Amer: 14 mL/min — ABNORMAL LOW (ref >60)
GFR calc non Af Amer: 11 mL/min — ABNORMAL LOW (ref >60)
GFR, EST AFRICAN AMERICAN: 12 mL/min — AB (ref >60)
GFR, EST AFRICAN AMERICAN: 13 mL/min — AB (ref >60)
GFR, EST NON AFRICAN AMERICAN: 13 mL/min — AB (ref >60)
GFR, EST NON AFRICAN AMERICAN: 11 mL/min — AB (ref >60)
Glucose, Bld: 131 mg/dL — ABNORMAL HIGH (ref 65–99)
Glucose, Bld: 97 mg/dL (ref 65–99)
Glucose, Bld: 86 mg/dL (ref 65–99)
POTASSIUM: 4.4 mmol/L (ref 3.5–5.1)
Potassium: 4.3 mmol/L (ref 3.5–5.1)
Potassium: 3.3 mmol/L — ABNORMAL LOW (ref 3.5–5.1)
SODIUM: 136 mmol/L (ref 135–145)
SODIUM: 142 mmol/L (ref 135–145)
SODIUM: 142 mmol/L (ref 135–145)

## 2015-09-17 LAB — URINALYSIS, ROUTINE W REFLEX MICROSCOPIC
Bilirubin Urine: NEGATIVE
Glucose, UA: NEGATIVE mg/dL
Ketones, ur: 15 mg/dL — AB
NITRITE: NEGATIVE
PROTEIN: 100 mg/dL — AB
SPECIFIC GRAVITY, URINE: 1.027 (ref 1.005–1.030)
pH: 5 (ref 5.0–8.0)

## 2015-09-17 LAB — URINE MICROSCOPIC-ADD ON

## 2015-09-17 LAB — RENAL FUNCTION PANEL
ANION GAP: 19 — AB (ref 5–15)
Albumin: 2.8 g/dL — ABNORMAL LOW (ref 3.5–5.0)
BUN: 55 mg/dL — AB (ref 6–20)
CO2: 17 mmol/L — AB (ref 22–32)
Calcium: 8.2 mg/dL — ABNORMAL LOW (ref 8.9–10.3)
Chloride: 104 mmol/L (ref 101–111)
Creatinine, Ser: 5.41 mg/dL — ABNORMAL HIGH (ref 0.61–1.24)
GFR calc Af Amer: 11 mL/min — ABNORMAL LOW (ref >60)
GFR calc non Af Amer: 10 mL/min — ABNORMAL LOW (ref >60)
GLUCOSE: 93 mg/dL (ref 65–99)
POTASSIUM: 3.2 mmol/L — AB (ref 3.5–5.1)
Phosphorus: 5.1 mg/dL — ABNORMAL HIGH (ref 2.5–4.6)
Sodium: 140 mmol/L (ref 135–145)

## 2015-09-17 LAB — POCT I-STAT 3, ART BLOOD GAS (G3+)
Acid-base deficit: 21 mmol/L — ABNORMAL HIGH (ref 0.0–2.0)
Acid-base deficit: 13 mmol/L — ABNORMAL HIGH (ref 0.0–2.0)
BICARBONATE: 10.2 meq/L — AB (ref 20.0–24.0)
Bicarbonate: 13.9 mEq/L — ABNORMAL LOW (ref 20.0–24.0)
O2 SAT: 46 %
O2 Saturation: 100 %
PCO2 ART: 48.4 mmHg — AB (ref 35.0–45.0)
TCO2: 12 mmol/L (ref 0–100)
TCO2: 15 mmol/L (ref 0–100)
pCO2 arterial: 35.2 mmHg (ref 35.0–45.0)
pH, Arterial: 6.931 — CL (ref 7.350–7.450)
pH, Arterial: 7.203 — ABNORMAL LOW (ref 7.350–7.450)
pO2, Arterial: 40 mmHg — ABNORMAL LOW (ref 80.0–100.0)
pO2, Arterial: 234 mmHg — ABNORMAL HIGH (ref 80.0–100.0)

## 2015-09-17 LAB — CBC
HEMATOCRIT: 29.4 % — AB (ref 39.0–52.0)
HEMOGLOBIN: 9.3 g/dL — AB (ref 13.0–17.0)
MCH: 29.3 pg (ref 26.0–34.0)
MCHC: 31.6 g/dL (ref 30.0–36.0)
MCV: 92.7 fL (ref 78.0–100.0)
Platelets: 156 10*3/uL (ref 150–400)
RBC: 3.17 MIL/uL — AB (ref 4.22–5.81)
RDW: 14.9 % (ref 11.5–15.5)
WBC: 18.3 10*3/uL — AB (ref 4.0–10.5)

## 2015-09-17 LAB — GLUCOSE, CAPILLARY
GLUCOSE-CAPILLARY: 118 mg/dL — AB (ref 65–99)
GLUCOSE-CAPILLARY: 107 mg/dL — AB (ref 65–99)
GLUCOSE-CAPILLARY: 102 mg/dL — AB (ref 65–99)
GLUCOSE-CAPILLARY: 78 mg/dL (ref 65–99)
GLUCOSE-CAPILLARY: 114 mg/dL — AB (ref 65–99)
Glucose-Capillary: 77 mg/dL (ref 65–99)

## 2015-09-17 LAB — LACTIC ACID, PLASMA
LACTIC ACID, VENOUS: 14.3 mmol/L — AB (ref 0.5–2.0)
LACTIC ACID, VENOUS: 12.6 mmol/L — AB (ref 0.5–2.0)
LACTIC ACID, VENOUS: 11 mmol/L — AB (ref 0.5–2.0)
Lactic Acid, Venous: 15.6 mmol/L (ref 0.5–2.0)

## 2015-09-17 LAB — LACTATE DEHYDROGENASE: LDH: 4936 U/L — AB (ref 98–192)

## 2015-09-17 MED ORDER — ALBUMIN HUMAN 5 % IV SOLN
25.0000 g | Freq: Once | INTRAVENOUS | Status: AC
  Administered 2015-09-17: 25 g via INTRAVENOUS

## 2015-09-17 MED ORDER — MIDAZOLAM HCL 2 MG/2ML IJ SOLN
INTRAMUSCULAR | Status: AC
  Administered 2015-09-17: 2 mg
  Filled 2015-09-17: qty 2

## 2015-09-17 MED ORDER — HEPARIN SODIUM (PORCINE) 1000 UNIT/ML DIALYSIS
1000.0000 [IU] | INTRAMUSCULAR | Status: DC | PRN
  Filled 2015-09-17: qty 6

## 2015-09-17 MED ORDER — SODIUM BICARBONATE 8.4 % IV SOLN
INTRAVENOUS | Status: DC
  Administered 2015-09-17 – 2015-09-18 (×5): via INTRAVENOUS
  Filled 2015-09-17 (×10): qty 150

## 2015-09-17 MED ORDER — NOREPINEPHRINE BITARTRATE 1 MG/ML IV SOLN
0.0000 ug/min | INTRAVENOUS | Status: DC
  Administered 2015-09-17: 10 ug/min via INTRAVENOUS
  Filled 2015-09-17: qty 4

## 2015-09-17 MED ORDER — PRISMASOL BGK 4/2.5 32-4-2.5 MEQ/L IV SOLN
INTRAVENOUS | Status: DC
  Administered 2015-09-17 – 2015-09-18 (×5): via INTRAVENOUS_CENTRAL
  Filled 2015-09-17 (×14): qty 5000

## 2015-09-17 MED ORDER — SODIUM BICARBONATE 8.4 % IV SOLN
50.0000 meq | Freq: Once | INTRAVENOUS | Status: AC
  Administered 2015-09-17: 50 meq via INTRAVENOUS

## 2015-09-17 MED ORDER — FENTANYL BOLUS VIA INFUSION
25.0000 ug | INTRAVENOUS | Status: DC | PRN
  Filled 2015-09-17: qty 50

## 2015-09-17 MED ORDER — CALCIUM CHLORIDE 10 % IV SOLN
INTRAVENOUS | Status: AC
  Administered 2015-09-17: 2 g via INTRAVENOUS
  Filled 2015-09-17: qty 20

## 2015-09-17 MED ORDER — SODIUM CHLORIDE 0.9 % IV SOLN
25.0000 ug/h | INTRAVENOUS | Status: DC
  Administered 2015-09-17 – 2015-09-18 (×3): 25 ug/h via INTRAVENOUS
  Filled 2015-09-17: qty 50

## 2015-09-17 MED ORDER — ETOMIDATE 2 MG/ML IV SOLN
INTRAVENOUS | Status: DC | PRN
  Administered 2015-09-17: 10 mg via INTRAVENOUS

## 2015-09-17 MED ORDER — ALBUMIN HUMAN 5 % IV SOLN
INTRAVENOUS | Status: AC
  Filled 2015-09-17: qty 250

## 2015-09-17 MED ORDER — PRISMASOL BGK 4/2.5 32-4-2.5 MEQ/L IV SOLN
INTRAVENOUS | Status: DC
  Administered 2015-09-17 – 2015-09-18 (×3): via INTRAVENOUS_CENTRAL
  Filled 2015-09-17 (×7): qty 5000

## 2015-09-17 MED ORDER — SODIUM CHLORIDE 0.9 % IV SOLN
INTRAVENOUS | Status: DC
  Administered 2015-09-17: 09:00:00 via INTRAVENOUS

## 2015-09-17 MED ORDER — PANTOPRAZOLE SODIUM 40 MG IV SOLR
40.0000 mg | INTRAVENOUS | Status: DC
  Administered 2015-09-17: 40 mg via INTRAVENOUS
  Filled 2015-09-17: qty 40

## 2015-09-17 MED ORDER — FENTANYL CITRATE (PF) 100 MCG/2ML IJ SOLN
50.0000 ug | INTRAMUSCULAR | Status: DC | PRN
  Administered 2015-09-17 (×3): 50 ug via INTRAVENOUS
  Filled 2015-09-17 (×3): qty 2

## 2015-09-17 MED ORDER — FENTANYL CITRATE (PF) 100 MCG/2ML IJ SOLN
50.0000 ug | INTRAMUSCULAR | Status: DC | PRN
  Administered 2015-09-17: 50 ug via INTRAVENOUS

## 2015-09-17 MED ORDER — PRISMASOL BGK 4/2.5 32-4-2.5 MEQ/L IV SOLN
INTRAVENOUS | Status: DC
  Administered 2015-09-17 – 2015-09-18 (×8): via INTRAVENOUS_CENTRAL
  Filled 2015-09-17 (×19): qty 5000

## 2015-09-17 MED ORDER — MIDAZOLAM HCL 2 MG/2ML IJ SOLN: 2.0000 mg | Freq: Once | INTRAMUSCULAR | Status: AC

## 2015-09-17 MED ORDER — CALCIUM CHLORIDE 10 % IV SOLN
2.0000 g | Freq: Once | INTRAVENOUS | Status: AC
  Administered 2015-09-17: 2 g via INTRAVENOUS

## 2015-09-17 MED ORDER — SODIUM BICARBONATE 8.4 % IV SOLN
100.0000 meq | Freq: Once | INTRAVENOUS | Status: AC
  Administered 2015-09-17: 100 meq via INTRAVENOUS

## 2015-09-17 MED ORDER — SODIUM CHLORIDE 0.9 % FOR CRRT
INTRAVENOUS_CENTRAL | Status: DC | PRN
  Filled 2015-09-17: qty 1000

## 2015-09-17 MED ORDER — DEXTROSE 5 % IV SOLN
0.0000 ug/min | INTRAVENOUS | Status: DC
  Administered 2015-09-17: 40 ug/min via INTRAVENOUS
  Administered 2015-09-17: 20 ug/min via INTRAVENOUS
  Administered 2015-09-18: 60 ug/min via INTRAVENOUS
  Filled 2015-09-17 (×5): qty 16

## 2015-09-17 MED ORDER — DEXTROSE 5 % IV SOLN
0.0000 ug/min | INTRAVENOUS | Status: DC
  Administered 2015-09-17 (×2): 20 ug/min via INTRAVENOUS
  Administered 2015-09-17: 30 ug/min via INTRAVENOUS
  Administered 2015-09-18 (×2): 100 ug/min via INTRAVENOUS
  Filled 2015-09-17 (×3): qty 4

## 2015-09-17 MED ORDER — SODIUM BICARBONATE 8.4 % IV SOLN
INTRAVENOUS | Status: AC
  Administered 2015-09-17: 100 meq via INTRAVENOUS
  Filled 2015-09-17: qty 100

## 2015-09-17 MED ORDER — PIPERACILLIN-TAZOBACTAM 3.375 G IVPB
3.3750 g | Freq: Four times a day (QID) | INTRAVENOUS | Status: DC
  Administered 2015-09-17 – 2015-09-18 (×3): 3.375 g via INTRAVENOUS
  Filled 2015-09-17 (×5): qty 50

## 2015-09-17 MED ORDER — SODIUM BICARBONATE 8.4 % IV SOLN
50.0000 meq | Freq: Once | INTRAVENOUS | Status: AC
  Administered 2015-09-17: 50 meq via INTRAVENOUS
  Filled 2015-09-17: qty 100

## 2015-09-17 MED ORDER — HEPARIN SODIUM (PORCINE) 1000 UNIT/ML IJ SOLN
3000.0000 [IU] | Freq: Once | INTRAMUSCULAR | Status: AC
  Administered 2015-09-17: 2400 [IU] via INTRAVENOUS
  Filled 2015-09-17: qty 3

## 2015-09-17 MED ORDER — FENTANYL CITRATE (PF) 100 MCG/2ML IJ SOLN
INTRAMUSCULAR | Status: AC
  Filled 2015-09-17: qty 2

## 2015-09-17 MED ORDER — SODIUM CHLORIDE 0.9 % IV BOLUS (SEPSIS)
500.0000 mL | Freq: Once | INTRAVENOUS | Status: AC
  Administered 2015-09-17: 500 mL via INTRAVENOUS

## 2015-09-17 MED ORDER — DEXMEDETOMIDINE HCL IN NACL 400 MCG/100ML IV SOLN
2.0000 ug/kg/h | INTRAVENOUS | Status: DC
  Administered 2015-09-17 – 2015-09-18 (×9): 2 ug/kg/h via INTRAVENOUS
  Filled 2015-09-17 (×11): qty 100

## 2015-09-17 MED ORDER — SUCCINYLCHOLINE CHLORIDE 20 MG/ML IJ SOLN
INTRAMUSCULAR | Status: DC | PRN
  Administered 2015-09-17: 100 mg via INTRAVENOUS

## 2015-09-17 MED ORDER — SODIUM BICARBONATE 4.2 % IV SOLN: 50.0000 meq | Freq: Once | INTRAVENOUS | Status: DC

## 2015-09-17 MED ORDER — VANCOMYCIN HCL IN DEXTROSE 1-5 GM/200ML-% IV SOLN
1000.0000 mg | INTRAVENOUS | Status: DC
  Administered 2015-09-17: 1000 mg via INTRAVENOUS
  Filled 2015-09-17 (×2): qty 200

## 2015-09-17 MED ORDER — DEXMEDETOMIDINE HCL IN NACL 200 MCG/50ML IV SOLN
0.4000 ug/kg/h | INTRAVENOUS | Status: DC
  Administered 2015-09-17 (×2): 1.2 ug/kg/h via INTRAVENOUS
  Filled 2015-09-17: qty 50

## 2015-09-17 MED FILL — Potassium Chloride Inj 2 mEq/ML: INTRAVENOUS | Qty: 40 | Status: AC

## 2015-09-17 MED FILL — Heparin Sodium (Porcine) Inj 1000 Unit/ML: INTRAMUSCULAR | Qty: 30 | Status: AC

## 2015-09-17 MED FILL — Magnesium Sulfate Inj 50%: INTRAMUSCULAR | Qty: 10 | Status: AC

## 2015-09-17 NOTE — Procedures (Signed)
Central Venous Catheter Insertion Procedure Note Jason HousekeeperDonald Schroeder [id number] 03/15/1946  Procedure: Insertion of Central Venous Catheter Indications: Assessment of intravascular volume, Drug and/or fluid administration and Frequent blood sampling  Procedure Details Consent: Risks of procedure as well as the alternatives and risks of each were explained to the (patient/caregiver).  Consent for procedure obtained.  Phone consent obtained from son - emergent procedure in setting of shock.   Time Out: Verified patient identification, verified procedure, site/side was marked, verified correct patient position, special equipment/implants available, medications/allergies/relevent history reviewed, required imaging and test results available.  Performed  Maximum sterile technique was used including antiseptics, cap, gloves, gown, hand hygiene, mask and sheet. Skin prep: Chlorhexidine; local anesthetic administered A antimicrobial bonded/coated triple lumen catheter was placed in the left internal jugular vein to 18 cm using the Seldinger technique.  Evaluation Blood flow good Complications: No apparent complications Patient did tolerate procedure well. Chest X-ray ordered to verify placement.  CXR: pending.   Procedure performed under direct supervision of Dr. Kendrick FriesMcQuaid and with ultrasound guidance for real time vessel cannulation.     Jason BrimBrandi Ollis, NP-C [facility] Pgr: [phone] or if no answer [phone] 09/17/2015, 11:14 AM

## 2015-09-17 NOTE — Progress Notes (Signed)
Patient ID: Jason HousekeeperDonald Schroeder, male   DOB: 01/25/1946, 69 y.o.   MRN: [id number]  He was in profound shock this am severe metabolic acidosis. Hemodynamics improved after giving bicarb and starting levophed. 2D echo done and reviewed by me. LV and RV function looked normal. No pericardial effusion. CVP is now 17. Acidosis improved but still 7.2 with BE -13. He is anuric at this time. Plan to start CVVH per nephrology to try to correct acidosis. There is still concern about possibility of ischemic gut but he would not be a candidate for any surgical therapy at this time so I think the only option is to support him, try to correct the acidosis and trend his lactate level.

## 2015-09-17 NOTE — Plan of Care (Signed)
First lab VP could only get one blood culture, to send someone else to try.

## 2015-09-17 NOTE — Progress Notes (Signed)
CT Surgery pm rounds  Events noted abd mildly distended PKUB ordered for am

## 2015-09-17 NOTE — Progress Notes (Signed)
ANTIBIOTIC CONSULT NOTE - Follow up Consult  Pharmacy Consult for Vancomycin and Zosyn Indication: sepsis  No Known Allergies  Patient Measurements: Height: 5\' 4"  (162.6 cm) Weight: 196 lb 13.9 oz (89.3 kg) IBW/kg (Calculated) : 59.2  Vital Signs: Temp: 98 F (36.7 C) (12/28 1544) Temp Source: Axillary (12/28 1544) BP: 104/80 mmHg (12/28 1600) Pulse Rate: 36 (12/28 1600) Intake/Output from previous day: 12/27 0701 - 12/28 0700 In: 1743.9 [P.O.:240; I.V.:1203.9; IV Piggyback:300] Out: 635 [Urine:485; Chest Tube:150] Intake/Output from this shift: Total I/O In: 3633.4 [I.V.:2633.4; IV Piggyback:1000] Out: 1276 [Urine:26; Emesis/NG output:1250]  Labs:  Recent Labs  09/16/15 0435 09/16/15 1713 09/16/15 1736 09/17/15 0410 09/17/15 1110 09/17/15 1510  WBC 16.6*  --  16.9* 18.3*  --   --   HGB 10.6* 9.5* 9.6* 9.3*  --   --   PLT 153  --  143* 156  --   --   CREATININE 2.45* 3.40* 3.59* 4.44* 5.03* 5.41*   Estimated Creatinine Clearance: 13.3 mL/min (by C-G formula based on Cr of 5.41).  Assessment: 69yom POD #2 CABG now with post-op shock, possibly due to sepsis, to begin vancomycin and zosyn. He developed acute renal failure post-op as well and is starting CVVHD this evening.  Last vancomycin doses given were 1250mg  @ 0838 and 1000mg  @ 2000 on 12/26. Since his vancomycin clearance is ~ 67 hours, he still has some on board and will not re-load prior to starting CVVHD.  Goal of Therapy:  Vancomycin trough level 15-20 mcg/ml  Plan:  1) Vancomycin 1000mg  IV q24 2) Zosyn 3.375g IV q6  Fredrik RiggerMarkle, Jacolby Risby Sue 09/17/2015,4:09 PM

## 2015-09-17 NOTE — Procedures (Signed)
Arterial Line Insertion Procedure Note Georgann HousekeeperDonald Ing [id number] 06/30/1946  Procedure: Insertion of Arterial Line Indications: BP monitoring, ABG  Procedure Details Consent: Unable to obtain consent because of emergent medical necessity. Time Out: Verified patient identification, verified procedure, site/side was marked, verified correct patient position, special equipment/implants available, medications/allergies/relevent history reviewed, required imaging and test results available.  Performed  Maximum sterile technique was used including antiseptics, cap, gloves, gown, hand hygiene, mask and sheet. Skin prep: Chlorhexidine; local anesthetic administered A single lumen arterial catheter was placed in the left femoral artery using the Seldinger technique.  Ultrasound was used to verify the patency of the vein and for real time needle guidance.  Evaluation Blood flow good Complications: No apparent complications Patient did tolerate procedure well.  Max FickleDouglas McQuaid 09/17/2015, 3:55 PM

## 2015-09-17 NOTE — Consult Note (Signed)
PULMONARY / CRITICAL CARE MEDICINE   Name: Jason Schroeder MRN: [id number] DOB: March 15, 1946    ADMISSION DATE:  09-19-2015 CONSULTATION DATE:  09/17/15  REFERRING MD:  Dr. Laneta Simmers   CHIEF COMPLAINT:  Hypotension, hypoxia  HISTORY OF PRESENT ILLNESS:   69 y/o M with PMH of anxiety, HTN, HLD, and atrial fibrillation (rate controlled, not on anticoagulation) who went in to [facility] on 12/25 via EMS with a one-week history of intermittent central chest pain.  On admission he was noted to have atrial fibrillation with RVR. The patient reported approximately one hour prior to presentation the chest pain became more severe and constant. The pain also radiated into his neck. EKG evaluation demonstrated atrial fibrillation with RVR, ST elevation in aVR and global ST depression across the precordium. Initial chest x-ray was consistent with pulmonary edema and troponin elevated to 0.54.  He was emergently taken to the Cath Lab where left heart cath demonstrated critical ostial left main stenosis of 95%, occluded small nondominant RCA, mild to moderate LV dysfunction with LVEF of 40-45%, I'll to moderate left ventricular systolic dysfunction. IABP was placed during left heart cath. CVTS was consulted for urgent CABG. The patient underwent CABG 3 (left internal mammary graft to LAD, free right internal mammary graft to the OM 2, SVG to OM1) on 12/26. He was returned to ICU postoperatively and remained in stable condition. He was extubated on 12/27 without difficulty. He remained on low dose Neo-Synephrine, Cardizem and dopamine. Overnight on 12/28 the patient became oliguric with a.m. labs reflecting an increase of serum creatinine to 4.4 and bicarbonate of 16. At shift change on the a.m. of 12/28 he was noted to be tachypneic, less responsive, hypotensive, bluish color and complaints of abdominal pain. He was immediately returned to bed and noted to be unresponsive with a distended abdomen. The patient  was intubated per anesthesia and OG tube inserted with immediate return of 1200 mL of dark green / brown fluid returned.  KUB demonstrated concerns for ileus. He was treated with 500 mL of albumin with an increase in blood pressure. He woke and was able to follow commands.  Subsequently he developed further hypotension and questionable saturation readings. PCCM consulted for evaluation.    The patient was collaboratively managed with Dr. Laneta Simmers.  He was treated with normal saline, Levothroid initiated, 6 amps of bicarbonate push with subsequent bicarbonate drip and new IV access established.  Nephrology was consulted for acidosis and worsening renal failure. Emergent echo completed at bedside with no evidence of tamponade or RV changes concerning for PE.  Arterial line was placed for hemodynamic monitoring.   PAST MEDICAL HISTORY :  He  has a past medical history of Anxiety; Hypertension; Atrial fibrillation (HCC); and Hyperlipidemia.  PAST SURGICAL HISTORY: He  has past surgical history that includes Back surgery (1983); Cardiac catheterization (N/A, 2015/09/19); Cardiac catheterization (N/A, 2015-09-19); Coronary artery bypass graft (N/A, 09/05/2015); and TEE without cardioversion (N/A, 08/24/2015).  No Known Allergies  No current facility-administered medications on file prior to encounter.   No current outpatient prescriptions on file prior to encounter.    FAMILY HISTORY:  His has no family status information on file.   SOCIAL HISTORY: He  reports that he quit smoking about 10 years ago. He has quit using smokeless tobacco. He reports that he does not drink alcohol.  REVIEW OF SYSTEMS:   Unable to complete as patient is altered on mechanical ventilation.   SUBJECTIVE:    VITAL SIGNS: BP  168/71 mmHg  Pulse 99  Temp(Src) 98.4 F (36.9 C) (Axillary)  Resp 25  Ht  (1.626 m)  Wt 196 lb 13.9 oz (89.3 kg)  BMI 33.78 kg/m2  SpO2 91%  HEMODYNAMICS:    VENTILATOR  SETTINGS: Vent Mode:  [-] PRVC FiO2 (%):  [100 %] 100 % Set Rate:  [14 bmp] 14 bmp Vt Set:  [560 mL] 560 mL PEEP:  [8 cmH20] 8 cmH20  INTAKE / OUTPUT: I/O last 3 completed shifts: In: 3473.7 [P.O.:240; I.V.:2513.7; NG/GT:120; IV Piggyback:600] Out: 1665 [Urine:1185; Chest Tube:480]  PHYSICAL EXAMINATION: General:  Critically ill adult male on vent  Neuro:  Sedate, occasional spontaneous movements HEENT:  ETT, mm pink/dry, no JVD  Cardiovascular:  s1s2 irr irr, distant tones, midline chest incision c/d/i Lungs:  Tachypnea (30's), lungs bilaterally clear, no wheeze / rhonchi Abdomen:  Distended, decreased bowel sounds, NGT with brown drainage  Musculoskeletal:  No acute deformities  Skin:  Pink, cool, nail beds dusky but good cap refill, doppler + dorsalis pedis pulses   LABS:  BMET  Recent Labs Lab 09/13/2015 2302  09/16/15 0435 09/16/15 1713 09/16/15 1736 09/17/15 0410  NA 140  < > 140 140  --  136  K 4.1  < > 4.2 4.2  --  4.4  CL 110  < > 110 104  --  104  CO2 21*  --  20*  --   --  16*  BUN 33*  < > 38* 42*  --  54*  CREATININE 1.30*  < > 2.45* 3.40* 3.59* 4.44*  GLUCOSE 180*  < > 138* 149*  --  131*  < > = values in this interval not displayed.  Electrolytes  Recent Labs Lab 09/03/2015 2302 09/28/2015 2023 09/16/15 0435 09/16/15 1736 09/17/15 0410  CALCIUM 9.9  --  8.1*  --  7.8*  MG  --  3.2* 2.9* 2.6*  --     CBC  Recent Labs Lab 09/16/15 0435 09/16/15 1713 09/16/15 1736 09/17/15 0410  WBC 16.6*  --  16.9* 18.3*  HGB 10.6* 9.5* 9.6* 9.3*  HCT 31.8* 28.0* 29.9* 29.4*  PLT 153  --  143* 156    Coag's  Recent Labs Lab 2015/09/28 0145 09-28-15 1410  APTT 58* 32  INR 1.15 1.46    Sepsis Markers No results for input(s): LATICACIDVEN, PROCALCITON, O2SATVEN in the last 168 hours.  ABG  Recent Labs Lab September 28, 2015 1919 09/16/15 0439 09/16/15 0831  PHART 7.258* 7.340* 7.349*  PCO2ART 46.7* 37.1 34.6*  PO2ART 90.0 99.0 75.0*    Liver  Enzymes  Recent Labs Lab 08/28/2015 2302  AST 29  ALT 32  ALKPHOS 95  BILITOT 0.4  ALBUMIN 3.6    Cardiac Enzymes No results for input(s): TROPONINI, PROBNP in the last 168 hours.  Glucose  Recent Labs Lab 09/16/15 1230 09/16/15 1627 09/16/15 1920 09/16/15 2339 09/17/15 0350 09/17/15 0739  GLUCAP 120* 124* 126* 121* 118* 107*    Imaging Dg Chest Port 1 View  09/17/2015  CLINICAL DATA:  Hypoxia EXAM: PORTABLE CHEST 1 VIEW COMPARISON:  Study obtained earlier in the day FINDINGS: Endotracheal tube tip is 3.1 cm above the carina. Nasogastric tube tip is at the junction of the mid and distal thirds of the esophagus. No pneumothorax. There is atelectatic change in the left base. Lungs elsewhere clear. Heart is mildly enlarged with pulmonary vascularity within normal limits. No adenopathy. IMPRESSION: Tube positions as described without pneumothorax. Note that the nasogastric tube tip  is at the junction of mid and distal thirds of the esophagus. Advise advancing nasogastric tube approximately 15 cm to insure that nasogastric tube tip and side port are well within the stomach. Left base atelectasis, stable. No new opacity. Stable cardiac prominence. These results will be called to the ordering clinician or representative by the Radiologist Assistant, and communication documented in the PACS or zVision Dashboard. Electronically Signed   By: Bretta Bang III M.D.   On: 09/17/2015 08:20   Dg Chest Port 1 View  09/17/2015  CLINICAL DATA:  Post coronary artery bypass. EXAM: PORTABLE CHEST 1 VIEW COMPARISON:  09/16/2015 FINDINGS: Swan-Ganz catheter has been removed. The nasogastric tube and chest tubes have been removed. Negative for a pneumothorax. Heart size is prominent and likely accentuated by the AP technique. Median sternotomy wires are present. The right jugular central venous catheter is still present. New hazy densities in the left lower lung are most compatible with atelectasis.  IMPRESSION: Removal of support apparatuses without a pneumothorax. Mild atelectasis in the left lung. Electronically Signed   By: Richarda Overlie M.D.   On: 09/17/2015 07:39   Dg Abd Portable 1v  09/17/2015  CLINICAL DATA:  Abdominal distention EXAM: PORTABLE ABDOMEN - 1 VIEW COMPARISON:  None. FINDINGS: Nasogastric tube tip and side-port in the stomach. There are loops of dilated small bowel in the mid abdomen. No appreciable air-fluid levels are noted on this supine examination. No free air is seen. There are foci of arterial vascular calcification. IMPRESSION: Loops of dilated bowel in the mid abdomen. Suspect ileus, although a degree of obstruction cannot be excluded. No free air is seen on this supine examination. Nasogastric tube tip and side port are in the stomach. Electronically Signed   By: Bretta Bang III M.D.   On: 09/17/2015 08:21     STUDIES:  12/28  KUB >> concern for ileus  12/28  ECHO >>   CULTURES:   ANTIBIOTICS:   SIGNIFICANT EVENTS: 12/25  Admit with chest pain, to LHC with critical obstruction 12/26  Urgent CABG x3 vessel 12/27  Extubated  12/28  Decompensated - hypotension, elevated lactic acid, ileus   LINES/TUBES: ETT 12/26 >> Cordis 12/26 >> 12/28 L IJ TLC 12/28 >>   DISCUSSION: 69 y/o M with PMH of HTN, anxiety and little prior medical care who was admitted with a one week hx of intermittent chest pain. Subsequently found to have three-vessel coronary disease on left heart cath. He underwent urgent CABG on 12/26. Ultimately was extubated on 12/27.  Decompensated on 12/28 with acute hypotension, mental status change and abdominal pain.  KUB consistent with ileus.  Elevated lactic acid concerning for bowel ischemia.   ASSESSMENT / PLAN:  PULMONARY A: Acute Hypoxic Respiratory Failure - no evidence of PE on ECHO, clear CXR P:   MV support, 8 cc/kg   Wean PEEP / FiO2 for sats > 92% Difficult to obtain ABG - unable to place arterial line  Intermittent  CXR   CARDIOVASCULAR A:  Cardiogenic Shock - in setting of severe acidosis, ileus, concern for possible bowel ischemia  NSTEMI  CAD s/p CABG x3 on 12/26 Atrial Fibrillation - off amio / diltiazem, rate controlled  HTN P:  ICU monitoring of hemodynamics  CVP Q4  Dopamine at 3 mcg Levophed, neo for MAP > 65 See Renal   RENAL A:   AG Metabolic Acidosis / Lactic Acidosis - s/p 6 amps bicarb 12/28 + bicarb gtt + calcium  Acute Kidney Injury -  in setting of contrast media, hypotension, IABP hx, CHF P:   Now BMP and again at 8pm Trend BMP / UOP, lactic acid  Bicarbonate gtt with 3 amps @ 13425ml/hr Replace electrolytes as indicated  Place HD catheter Nephrology consulted, appreciate input   GASTROINTESTINAL A:   Ileus - new 12/28 Concern for Bowel Ischemia - elevated lactic acid, hypotension  P:   Will need to consider imaging abdomen once stabilized  NPO PPI  OGT to LIS for decompression   HEMATOLOGIC A:   Anemia - suspect acute blood loss  P:  Trend BMP  SCD's for DVT prophylaxis   INFECTIOUS A:   Leukocytosis  Post-OP CABG - no evidence of acute infection.  P:   Monitor fever curve / WBC   ENDOCRINE A:   Hyperglycemia    P:   SSI Q4  NEUROLOGIC A:   Acute Metabolic Encephalopathy  P:   RASS goal: 0 to -1  Precedex gtt  PRN Fentanyl for pain    FAMILY  - Updates: Son updated on patients status.    - Inter-disciplinary family meet or Palliative Care meeting due by:  09/25/15    Canary BrimBrandi Ollis, NP-C [facility] Pgr: [phone] or if no answer [phone] 09/17/2015, 11:03 AM

## 2015-09-17 NOTE — Progress Notes (Signed)
Echocardiogram 2D Echocardiogram limited has been performed.  Jason Schroeder, Jason Schroeder 09/17/2015, 11:13 AM

## 2015-09-17 NOTE — Progress Notes (Signed)
CRITICAL VALUE ALERT  Critical value received:  Lactic Acid 14.3  Date of notification:  09/17/2015  Time of notification:  13:00  Critical value read back:Yes.    Nurse who received alert:  Kennith Centerachel Tarwater, RN  MD notified (1st page):  NP Canary BrimBrandi Ollis  Time of first page:  13:01  Responding MD:  NP Canary BrimBrandi Ollis  Time MD responded:  13:02 (NP at bedside)

## 2015-09-17 NOTE — Consult Note (Signed)
Jason HousekeeperDonald Schroeder Admit Date: 09/13/2015 09/17/2015 Jason Schroeder, Jason Schroeder:  Jason SimmersBartle MD  Reason for Consult:  AKI, anuric HPI:  53M seen at request of Dr. Laneta Schroeder for above issues.  Issues pertinent past medical history includes hypertension, atrial fibrillation, and hyperlipidemia.  As best can tell, baseline serum 3. Pt admitted 09/13/2015 with CP and found to have NSTEMI and on LHC high grade LM lesion as primary finding.  Required IABP.  Contrast exposure of 100mL at time of LHC.  Patient underwent CABG on 08/30/2015 and return to the ICU intubated, on pressors, and with balloon pump in place.  He was extubated on 12/27. This morning on rounds the patient was found unresponsive, poor respiratory effort, hypotensive, and with and a distended abdomen.  Patient was intubated and had a large volume of drainage from an OG tube when placed. He has become progressively acidotic and hypotensive requiring IV bicarbonate and ongoing pressor support.  Lactate elevated  He has had progressive deterioration in renal function as charted below. He has become anuric. No subsequent contrast exposure following cardiac catheterization.   CREATININE, SER (mg/dL)  Date Value  16/10/960412/28/2016 4.44*  09/16/2015 3.59*  09/16/2015 3.40*  09/16/2015 2.45*  08/26/2015 1.99*  08/30/2015 1.90*  09/02/2015 1.50*  09/17/2015 1.40*  09/05/2015 1.30*  08/25/2015 1.40*  ] I/Os: I/O last 3 completed shifts: In: 3473.7 [P.O.:240; I.V.:2513.7; NG/GT:120; IV Piggyback:600] Out: 1665 [Urine:1185; Chest Tube:480]  ROS Balance of 12 systems is negative w/ exceptions as above  PMH  Past Medical History  Diagnosis Date  . Anxiety   . Hypertension   . Atrial fibrillation (HCC)   . Hyperlipidemia    PSH  Past Surgical History  Procedure Laterality Date  . Back surgery  1983  . Cardiac catheterization N/A 09/13/2015    Procedure: Left Heart Cath and Coronary Angiography;  Surgeon: Peter M SwazilandJordan, MD;   [facility];  Service: Cardiovascular;  Laterality: N/A;  . Cardiac catheterization N/A 09/13/2015    Procedure: IABP Insertion;  Surgeon: Peter M SwazilandJordan, MD;  [facility];  Service: Cardiovascular;  Laterality: N/A;  . Coronary artery bypass graft N/A 08/26/2015    Procedure: CORONARY ARTERY BYPASS GRAFTING x 3 (LIMA-LAD, SVG-OM1, Free RIMA-OM2) ENDOSCOPIC GREATER SAPHENOUS VEIN HARVEST BILATERAL THIGHS);  Surgeon: Alleen BorneBryan K Bartle, MD;  [facility];  Service: Open Heart Surgery;  Laterality: N/A;  . Tee without cardioversion N/A 09/06/2015    Procedure: TRANSESOPHAGEAL ECHOCARDIOGRAM (TEE);  Surgeon: Alleen BorneBryan K Bartle, MD;  [facility];  Service: Open Heart Surgery;  Laterality: N/A;   FH  Family History  Problem Relation Age of Onset  . Heart attack Brother    SH  reports that he quit smoking about 10 years ago. He has quit using smokeless tobacco. He reports that he does not drink alcohol. His drug history is not on file. Allergies No Known Allergies Home medications Prior to Admission medications   Not on File    Current Medications Scheduled Meds: . albumin human      . antiseptic oral rinse  7 mL Mouth Rinse BID  . antiseptic oral rinse  7 mL Mouth Rinse QID  . atorvastatin  80 mg Oral q1800  . bisacodyl  10 mg Oral Daily   Or  . bisacodyl  10 mg Rectal Daily  . chlorhexidine gluconate  15 mL Mouth Rinse BID  . insulin aspart  0-24 Units Subcutaneous 6 times per day  . midazolam  2 mg Intravenous Once  . sodium chloride  3 mL Intravenous Q12H   Continuous Infusions: . sodium chloride Stopped (09/16/15 1040)  . sodium chloride    . sodium chloride 10 mL/hr at 09/16/15 0900  . sodium chloride 10 mL/hr at 09/17/15 0910  . dexmedetomidine 2 mcg/kg/hr (09/17/15 1036)  . DOPamine 3.26 mcg/kg/min (09/17/15 0919)  . norepinephrine (LEVOPHED) Adult infusion    . phenylephrine (NEO-SYNEPHRINE) Adult infusion    .  sodium bicarbonate   infusion 1000 mL 125 mL/hr at 09/17/15 1035   PRN Meds:.sodium chloride, Influenza vac split quadrivalent PF, morphine injection, ondansetron (ZOFRAN) IV, sodium chloride  CBC  Recent Labs Lab 08/25/2015 2302  09/16/15 0435 09/16/15 1713 09/16/15 1736 09/17/15 0410  WBC 11.2*  < > 16.6*  --  16.9* 18.3*  NEUTROABS 8.9*  --   --   --   --   --   HGB 12.5*  < > 10.6* 9.5* 9.6* 9.3*  HCT 37.9*  < > 31.8* 28.0* 29.9* 29.4*  MCV 90.2  < > 92.2  --  93.4 92.7  PLT 216  < > 153  --  143* 156  < > = values in this interval not displayed. Basic Metabolic Panel  Recent Labs Lab 08/28/2015 2302  09/24/2015 1053 09/24/15 1204 24-Sep-2015 1248 09/24/15 1412 Sep 24, 2015 2012 09/24/15 2023 09/16/15 0435 09/16/15 1713 09/16/15 1736 09/17/15 0410  NA 140  < > 139 138 136 139 141  --  140 140  --  136  K 4.1  < > 4.1 5.3* 5.1 4.2 4.3  --  4.2 4.2  --  4.4  CL 110  < > 100* 100* 101  --  107  --  110 104  --  104  CO2 21*  --   --   --   --   --   --   --  20*  --   --  16*  GLUCOSE 180*  < > 119* 140* 153* 124* 161*  --  138* 149*  --  131*  BUN 33*  < > 29* 32* 34*  --  33*  --  38* 42*  --  54*  CREATININE 1.30*  < > 1.30* 1.40* 1.50*  --  1.90* 1.99* 2.45* 3.40* 3.59* 4.44*  CALCIUM 9.9  --   --   --   --   --   --   --  8.1*  --   --  7.8*  < > = values in this interval not displayed.  Physical Exam  Blood pressure 59/22, pulse 67, temperature 98.4 F (36.9 C), temperature source Axillary, resp. rate 28, height  (1.626 m), weight 89.3 kg (196 lb 13.9 oz), SpO2 65 %. GEN: intubated, sedated ENT: NCAT, OGT in place, ETT in place EYES: eyes closed CV: tachy, regular, no rub PULM: coarse bs b/l ABD: mildly distended, firm SKIN: surgical incisions noted NEURO: heavily sedated   Assessment 2M admit 12/25 with NSTEMI, req emergent CABG 12/26, now with shock, anuric AKI  1. Anuric AKI 2/2 CIN, IABP, CHF, perioperative ATN 2. Cardiogenic Shock on Dopa, NE, PE 3. VDRF, Hypoxic  RF 4. Ileus 5. NSTEMI s/p 3V CABG 12/26 6. Metabolic Acidosis on NaHCO3 gtt 7. Leukocytosis 8. ABLA  Plan 1. Pt will most likely need CRRT support for metabolic and volume complications 2. CCM to place HD cath once pt stablized   Sabra Heck MD [phone] pgr 09/17/2015, 10:45 AM

## 2015-09-17 NOTE — Progress Notes (Signed)
[facility] P[facility]M  Came back for afternoon re-round  Lactic acid slowly trending down Acidosis improved Shock somewhat improved  WBC up 18k  Now on CVVHD  Shock> still not clear cause; acidosis contributed greatly, agree it could be ischemic bowel but lactic acid improving; no clear source of sepsis but with elevated WBC and uncertain cause of his decline today will empirically cover for sepsis with broad spectrum antibiotics and will add blood cultures  Total CC time by me today is 120 minutes  Heber CarolinaBrent McQuaid, MD [place] [facility] Pager: [phone] Cell: [phone] After 3pm or if no response, call [phone]

## 2015-09-17 NOTE — Transfer of Care (Signed)
Immediate Anesthesia Transfer of Care Note  Patient: Jason Schroeder  Procedure(s) Performed: * No procedures listed *  Patient Location: ICU  Anesthesia Type:General  Level of Consciousness: Patient remains intubated per anesthesia plan  Airway & Oxygen Therapy: Patient remains intubated per anesthesia plan and Patient placed on Ventilator (see vital sign flow sheet for setting)  Post-op Assessment: Report given to RN  Post vital signs: Reviewed  Last Vitals:  Filed Vitals:   09/17/15 0755 09/17/15 0759  BP: 92/60   Pulse: 88 92  Temp:    Resp: 17 18    Complications: No apparent anesthesia complications

## 2015-09-17 NOTE — Anesthesia Procedure Notes (Signed)
Procedure Name: Intubation Date/Time: 09/17/2015 7:50 AM Performed by: Adonis HousekeeperNGELL, Dia Donate M Pre-anesthesia Checklist: Patient identified, Emergency Drugs available, Suction available and Patient being monitored Patient Re-evaluated:Patient Re-evaluated prior to inductionOxygen Delivery Method: Circle system utilized Preoxygenation: Pre-oxygenation with 100% oxygen Intubation Type: IV induction Ventilation: Mask ventilation without difficulty Laryngoscope Size: McGraph and 4 Grade View: Grade I Tube type: Oral Number of attempts: 1 Airway Equipment and Method: Stylet and Video-laryngoscopy Placement Confirmation: ETT inserted through vocal cords under direct vision,  breath sounds checked- equal and bilateral and CO2 detector Secured at: 22 cm Tube secured with: Tape Dental Injury: Teeth and Oropharynx as per pre-operative assessment

## 2015-09-17 NOTE — Plan of Care (Signed)
Problem: Pain Managment: Goal: General experience of comfort will improve Outcome: Progressing Patient resedated d/t intubation and will require CRRT.

## 2015-09-17 NOTE — Plan of Care (Signed)
Walked into room pt yelling in pain, cannot pick up sat despite multiple ways, pt placed back to bed, eyes open with minimal response, Dr. Laneta SimmersBartle here, pt intubated, OG placed

## 2015-09-17 NOTE — Plan of Care (Signed)
Patient agitated and restless, pulling at tubes, restrained

## 2015-09-17 NOTE — Procedures (Signed)
Hemodialysis Catheter Insertion Procedure Note Jason HousekeeperDonald Schroeder [id number] 03/04/1946  Procedure: Insertion of Hemodialysis Catheter Indications: Hemodialysis  Procedure Details Consent: Risks of procedure as well as the alternatives and risks of each were explained to the (patient/caregiver).  Consent for procedure obtained.  Time Out: Verified patient identification, verified procedure, site/side was marked, verified correct patient position, special equipment/implants available, medications/allergies/relevent history reviewed, required imaging and test results available.  Performed  Maximum sterile technique was used including antiseptics, cap, gloves, gown, hand hygiene, mask and sheet.  Skin prep: Chlorhexidine; local anesthetic administered  A Trialysis HD catheter was placed in the right internal jugular vein using the Seldinger technique.  Evaluation Blood flow good Complications: No apparent complications Patient did tolerate procedure well. Chest X-ray ordered to verify placement.  CXR: pending.   Procedure performed under direct supervision of Dr. Kendrick FriesMcQuaid and with ultrasound guidance for real time vessel cannulation.     Jason BrimBrandi Jamori Biggar, NP-C [facility] Pgr: [phone] or [phone] 09/17/2015, 1:31 PM

## 2015-09-17 NOTE — Plan of Care (Signed)
Lactic acid trending down, currently 11.0, MD aware of trending downward

## 2015-09-17 NOTE — Progress Notes (Signed)
CRITICAL VALUE ALERT  Critical value received: Lactic Acid 12.6  Date of notification:  09/17/2015  Time of notification:  14:35  Critical value read back:Yes.    Nurse who received alert: Kennith Centerachel Jinnie Onley, RN  Lactic Acid trending down.

## 2015-09-17 NOTE — Progress Notes (Signed)
CRITICAL VALUE ALERT  Critical value received:  Lactic Acid 15.6  Date of notification:  09/17/2015  Time of notification:  11:30  Critical value read back:Yes.    Nurse who received alert:  Kennith Centerachel Telesia Ates, RN  MD notified (1st page):  NP Canary BrimBrandi Ollis  Time of first page:  11:31  Responding MD:  NP Canary BrimBrandi Ollis  Time MD responded:  11:31 (NP at bedside, given verbal notification with read back)

## 2015-09-17 NOTE — Plan of Care (Signed)
K 3.5 called to Dr. Marisue HumbleSanford.

## 2015-09-17 NOTE — Progress Notes (Signed)
2 Days Post-Op Procedure(s) (LRB): CORONARY ARTERY BYPASS GRAFTING x 3 (LIMA-LAD, SVG-OM1, Free RIMA-OM2) ENDOSCOPIC GREATER SAPHENOUS VEIN HARVEST BILATERAL THIGHS) (N/A) TRANSESOPHAGEAL ECHOCARDIOGRAM (TEE) (N/A) Subjective:  Patient was stable through the day yesterday after getting extubated and getting IABP out. He remained on neo at 20 mcg yesterday. Started on cardizem last pm for RVR 120's in a-fib. Creat last pm rose to 3.4 from 2.4 in the morning but urine output maintained at 20/hr until midnight and then became oliguric at 5-10 cc/hr. Creat this am 4.4 with bicarb of 16. He was up to chair this am and reportedly ok but at change of shift he was noted by nurse to be tachypneic with hypotension, less responsive, bluish color and complaining of abdominal pain. He was put back to bed which is when I saw him and he was barely breathing, unresponsive with cuff BP in the 50's, distended abdomen. He was intubated by anesthesia and OG tube inserted and drained 1200 cc of dark fluid immediately with decrease in abdominal distension. He received 500 cc albumin with a rise in BP to 110 and rhythm is chronic a-fib 80's. He has woke up and followed commands and was agitated so sedated .   Objective: Vital signs in last 24 hours: Temp:  [97.3 F (36.3 C)-101.3 F (38.5 C)] 98.4 F (36.9 C) (12/28 0744) Pulse Rate:  [34-126] 73 (12/28 0830) Cardiac Rhythm:  [-] Atrial fibrillation (12/28 0700) Resp:  [17-45] 28 (12/28 0830) BP: (59-130)/(35-110) 97/63 mmHg (12/28 0830) SpO2:  [54 %-100 %] 82 % (12/28 0830) Arterial Line BP: (55-101)/(44-66) 55/44 mmHg (12/28 0645) Weight:  [89.3 kg (196 lb 13.9 oz)] 89.3 kg (196 lb 13.9 oz) (12/28 0630)  Hemodynamic parameters for last 24 hours: PAP: (41-47)/(24-28) 47/27 mmHg  Intake/Output from previous day: 12/27 0701 - 12/28 0700 In: 1743.9 [P.O.:240; I.V.:1203.9; IV Piggyback:300] Out: 635 [Urine:485; Chest Tube:150] Intake/Output this shift: Total  I/O In: 34.6 [I.V.:34.6] Out: 1250 [Emesis/NG output:1250]  General appearance: intubated and sedated Heart: irregularly irregular rhythm Lungs: rhonchi left lung Abdomen: no BS, distended but less so after OG tube. Does not appear tender but he has been sedated. Extremities: edema moderate Wound: incisions ok  Lab Results:  Recent Labs  09/16/15 1736 09/17/15 0410  WBC 16.9* 18.3*  HGB 9.6* 9.3*  HCT 29.9* 29.4*  PLT 143* 156   BMET:  Recent Labs  09/16/15 0435 09/16/15 1713 09/16/15 1736 09/17/15 0410  NA 140 140  --  136  K 4.2 4.2  --  4.4  CL 110 104  --  104  CO2 20*  --   --  16*  GLUCOSE 138* 149*  --  131*  BUN 38* 42*  --  54*  CREATININE 2.45* 3.40* 3.59* 4.44*  CALCIUM 8.1*  --   --  7.8*    PT/INR:  Recent Labs  09/01/2015 1410  LABPROT 17.8*  INR 1.46   ABG    Component Value Date/Time   PHART 7.349* 09/16/2015 0831   HCO3 18.7* 09/16/2015 0831   TCO2 18 09/16/2015 1713   ACIDBASEDEF 6.0* 09/16/2015 0831   O2SAT 93.0 09/16/2015 0831   CBG (last 3)   Recent Labs  09/16/15 2339 09/17/15 0350 09/17/15 0739  GLUCAP 121* 118* 107*   CLINICAL DATA: Hypoxia  EXAM: PORTABLE CHEST 1 VIEW  COMPARISON: Study obtained earlier in the day  FINDINGS: Endotracheal tube tip is 3.1 cm above the carina. Nasogastric tube tip is at the junction of the mid  and distal thirds of the esophagus. No pneumothorax. There is atelectatic change in the left base. Lungs elsewhere clear. Heart is mildly enlarged with pulmonary vascularity within normal limits. No adenopathy.  IMPRESSION: Tube positions as described without pneumothorax. Note that the nasogastric tube tip is at the junction of mid and distal thirds of the esophagus. Advise advancing nasogastric tube approximately 15 cm to insure that nasogastric tube tip and side port are well within the stomach.  Left base atelectasis, stable. No new opacity. Stable cardiac prominence.  These  results will be called to the ordering clinician or representative by the Radiologist Assistant, and communication documented in the PACS or zVision Dashboard.   Electronically Signed  By: Bretta BangWilliam Woodruff III M.D.  On: 09/17/2015 08:20  CLINICAL DATA: Abdominal distention  EXAM: PORTABLE ABDOMEN - 1 VIEW  COMPARISON: None.  FINDINGS: Nasogastric tube tip and side-port in the stomach. There are loops of dilated small bowel in the mid abdomen. No appreciable air-fluid levels are noted on this supine examination. No free air is seen. There are foci of arterial vascular calcification.  IMPRESSION: Loops of dilated bowel in the mid abdomen. Suspect ileus, although a degree of obstruction cannot be excluded. No free air is seen on this supine examination. Nasogastric tube tip and side port are in the stomach.   Electronically Signed  By: Bretta BangWilliam Woodruff III M.D.  On: 09/17/2015 08:21  Assessment/Plan: S/P Procedure(s) (LRB): CORONARY ARTERY BYPASS GRAFTING x 3 (LIMA-LAD, SVG-OM1, Free RIMA-OM2) ENDOSCOPIC GREATER SAPHENOUS VEIN HARVEST BILATERAL THIGHS) (N/A) TRANSESOPHAGEAL ECHOCARDIOGRAM (TEE) (N/A)  Acute decompensation this am with respiratory failure, hypotension requiring intubation. Etiology not completely clear but I suspect it is related to his gut with large volume of dark OG drainage, ileus on KUB, acidosis on BMET, respiratory compromise due to abdominal distension and hypotension probably due to volume depletion from fluid loss into gut. I have consulted CCM and Nephrology to assist with his care.  1. CV: Hemodynamics marginal at this time probably due to volume depletion from GI losses. Will give fluid, check CVP. Acidosis likely contributing to hemodynamic instability and will check ABG once arterial line is in. Preop EF 40-45%.  2. Acute respiratory failure due to abdominal distension, hypotension. CXR looks ok after intubation.  3. Acute on  chronic renal failure due to cath, surgery, possibly IABP, intravascular volume depletion. Nephrology consulted.  4. Ileus: etiology unclear. May be post-surgical but concerned about gut ischemia with his cardiovascular disease, IABP. Check lactate level and follow.  5. ID: no sign of sepsis at this time.   Son notified by CCM of his current problems.     LOS: 2 days    Alleen BorneBryan K Bartle 09/17/2015

## 2015-09-17 NOTE — Progress Notes (Signed)
Urine output 10-20 cc per hour over the last three hours, Dr. Dorris FetchHendrickson notified and orders received. Will continue to monitor.

## 2015-09-17 NOTE — Plan of Care (Signed)
Problem: Fluid Volume: Goal: Ability to maintain a balanced intake and output will improve Outcome: Not Progressing Patient to start on CRRT.

## 2015-09-17 NOTE — Progress Notes (Signed)
Attempted left femoral arterial line, unable to place.  No radial artery identified on ultrasound at left radial.  Prior LHC from R radial (avoided) and R femoral IABP.   Canary BrimBrandi Tiena Manansala, NP-C [facility] Pgr: [phone] or if no answer [phone] 09/17/2015, 11:59 AM

## 2015-09-18 ENCOUNTER — Inpatient Hospital Stay (HOSPITAL_COMMUNITY): Payer: Medicare HMO

## 2015-09-18 LAB — POCT I-STAT 3, ART BLOOD GAS (G3+)
ACID-BASE DEFICIT: 13 mmol/L — AB (ref 0.0–2.0)
Acid-base deficit: 16 mmol/L — ABNORMAL HIGH (ref 0.0–2.0)
BICARBONATE: 11.6 meq/L — AB (ref 20.0–24.0)
BICARBONATE: 13.9 meq/L — AB (ref 20.0–24.0)
O2 SAT: 91 %
O2 Saturation: 90 %
PH ART: 7.14 — AB (ref 7.350–7.450)
PH ART: 7.234 — AB (ref 7.350–7.450)
PO2 ART: 74 mmHg — AB (ref 80.0–100.0)
TCO2: 15 mmol/L (ref 0–100)
TCO2: 13 mmol/L (ref 0–100)
pCO2 arterial: 31.5 mmHg — ABNORMAL LOW (ref 35.0–45.0)
pCO2 arterial: 33.9 mmHg — ABNORMAL LOW (ref 35.0–45.0)
pO2, Arterial: 60 mmHg — ABNORMAL LOW (ref 80.0–100.0)

## 2015-09-18 LAB — CBC
HCT: 27.4 % — ABNORMAL LOW (ref 39.0–52.0)
HEMOGLOBIN: 8.6 g/dL — AB (ref 13.0–17.0)
MCH: 29.9 pg (ref 26.0–34.0)
MCHC: 31.4 g/dL (ref 30.0–36.0)
MCV: 95.1 fL (ref 78.0–100.0)
Platelets: 121 10*3/uL — ABNORMAL LOW (ref 150–400)
RBC: 2.88 MIL/uL — AB (ref 4.22–5.81)
RDW: 14.9 % (ref 11.5–15.5)
WBC: 8.2 10*3/uL (ref 4.0–10.5)

## 2015-09-18 LAB — COMPREHENSIVE METABOLIC PANEL
ALT: 3660 U/L — ABNORMAL HIGH (ref 17–63)
ANION GAP: 23 — AB (ref 5–15)
AST: 3808 U/L — ABNORMAL HIGH (ref 15–41)
Albumin: 2.7 g/dL — ABNORMAL LOW (ref 3.5–5.0)
Alkaline Phosphatase: 100 U/L (ref 38–126)
BUN: 32 mg/dL — ABNORMAL HIGH (ref 6–20)
CALCIUM: 7.2 mg/dL — AB (ref 8.9–10.3)
CHLORIDE: 101 mmol/L (ref 101–111)
CO2: 14 mmol/L — AB (ref 22–32)
Creatinine, Ser: 3.69 mg/dL — ABNORMAL HIGH (ref 0.61–1.24)
GFR calc non Af Amer: 16 mL/min — ABNORMAL LOW (ref >60)
GFR, EST AFRICAN AMERICAN: 18 mL/min — AB (ref >60)
Glucose, Bld: 100 mg/dL — ABNORMAL HIGH (ref 65–99)
Potassium: 3.8 mmol/L (ref 3.5–5.1)
SODIUM: 138 mmol/L (ref 135–145)
Total Bilirubin: 1.8 mg/dL — ABNORMAL HIGH (ref 0.3–1.2)
Total Protein: 4.5 g/dL — ABNORMAL LOW (ref 6.5–8.1)

## 2015-09-18 LAB — URINE CULTURE
CULTURE: NO GROWTH
SPECIAL REQUESTS: NORMAL

## 2015-09-18 LAB — CARBOXYHEMOGLOBIN
CARBOXYHEMOGLOBIN: 0.5 % (ref 0.5–1.5)
METHEMOGLOBIN: 0.9 % (ref 0.0–1.5)
O2 Saturation: 55.1 %
TOTAL HEMOGLOBIN: 8.6 g/dL — AB (ref 13.5–18.0)

## 2015-09-18 LAB — GLUCOSE, CAPILLARY
GLUCOSE-CAPILLARY: 101 mg/dL — AB (ref 65–99)
GLUCOSE-CAPILLARY: 122 mg/dL — AB (ref 65–99)

## 2015-09-18 LAB — MAGNESIUM: MAGNESIUM: 2.4 mg/dL (ref 1.7–2.4)

## 2015-09-18 LAB — PREPARE RBC (CROSSMATCH)

## 2015-09-18 LAB — LACTIC ACID, PLASMA: Lactic Acid, Venous: 14.8 mmol/L (ref 0.5–2.0)

## 2015-09-18 LAB — PHOSPHORUS: Phosphorus: 5.9 mg/dL — ABNORMAL HIGH (ref 2.5–4.6)

## 2015-09-18 MED ORDER — SODIUM BICARBONATE 8.4 % IV SOLN
100.0000 meq | Freq: Once | INTRAVENOUS | Status: AC
  Administered 2015-09-18: 100 meq via INTRAVENOUS
  Filled 2015-09-18: qty 100

## 2015-09-18 MED ORDER — SODIUM BICARBONATE 8.4 % IV SOLN
50.0000 meq | Freq: Once | INTRAVENOUS | Status: AC
  Administered 2015-09-18: 50 meq via INTRAVENOUS
  Filled 2015-09-18: qty 50

## 2015-09-18 MED ORDER — PIPERACILLIN-TAZOBACTAM 3.375 G IVPB 30 MIN
3.3750 g | Freq: Four times a day (QID) | INTRAVENOUS | Status: DC
  Filled 2015-09-18 (×4): qty 50

## 2015-09-18 MED ORDER — CALCIUM CHLORIDE 10 % IV SOLN
1.0000 g | Freq: Once | INTRAVENOUS | Status: AC
  Administered 2015-09-18: 1 g via INTRAVENOUS

## 2015-09-18 MED ORDER — SODIUM CHLORIDE 0.9 % IV SOLN
Freq: Once | INTRAVENOUS | Status: AC
  Administered 2015-09-18: 04:00:00 via INTRAVENOUS

## 2015-09-18 MED ORDER — ALBUMIN HUMAN 5 % IV SOLN
12.5000 g | Freq: Once | INTRAVENOUS | Status: AC
  Administered 2015-09-18: 12.5 g via INTRAVENOUS
  Filled 2015-09-18: qty 250

## 2015-09-18 MED ORDER — VASOPRESSIN 20 UNIT/ML IV SOLN
0.0400 [IU]/min | INTRAVENOUS | Status: DC
  Administered 2015-09-18: 0.03 [IU]/min via INTRAVENOUS
  Filled 2015-09-18 (×2): qty 2

## 2015-09-18 MED FILL — Sodium Bicarbonate IV Soln 8.4%: INTRAVENOUS | Qty: 50 | Status: AC

## 2015-09-18 MED FILL — Mannitol IV Soln 20%: INTRAVENOUS | Qty: 500 | Status: AC

## 2015-09-18 MED FILL — Electrolyte-R (PH 7.4) Solution: INTRAVENOUS | Qty: 3000 | Status: AC

## 2015-09-18 MED FILL — Sodium Chloride IV Soln 0.9%: INTRAVENOUS | Qty: 2000 | Status: AC

## 2015-09-18 MED FILL — Heparin Sodium (Porcine) Inj 1000 Unit/ML: INTRAMUSCULAR | Qty: 10 | Status: AC

## 2015-09-18 MED FILL — Lidocaine HCl IV Inj 20 MG/ML: INTRAVENOUS | Qty: 5 | Status: AC

## 2015-09-19 LAB — TYPE AND SCREEN
ABO/RH(D): B POS
Antibody Screen: NEGATIVE
UNIT DIVISION: 0
Unit division: 0

## 2015-09-20 LAB — CULTURE, BLOOD (ROUTINE X 2)

## 2015-09-20 LAB — CULTURE, RESPIRATORY W GRAM STAIN: Special Requests: NORMAL

## 2015-09-20 LAB — CULTURE, RESPIRATORY

## 2015-09-21 NOTE — Progress Notes (Signed)
eLink Physician-Brief Progress Note Patient Name: Jason HousekeeperDonald Schroeder DOB: 11/19/1945 MRN: [id number]   Date of Service  08/22/2015  HPI/Events of Note  Persistent hypotension with MAP of 50 in the setting of max doses of NEO and NE.  CVP is 10  eICU Interventions  Add Vasopressin for BP support     Intervention Category Major Interventions: Shock - evaluation and management  DETERDING,ELIZABETH 09/09/2015, 2:26 AM

## 2015-09-21 NOTE — Progress Notes (Signed)
CRITICAL VALUE ALERT  Critical value received:  Lactic acid-14.8  Date of notification:  08/29/2015  Time of notification:  0330  Critical value read back:Yes.    Nurse who received alert:  Marcellina MillinMindy Hopper, RN  MD notified (1st page):  Dr. Darrick Pennaeterding  Time of first page:  0340  MD notified (2nd page):  Time of second page:  Responding MD:  Dr. Darrick Pennaeterding  Time MD responded:  [phone]

## 2015-09-21 NOTE — Progress Notes (Signed)
3 Days Post-Op Procedure(s) (LRB): CORONARY ARTERY BYPASS GRAFTING x 3 (LIMA-LAD, SVG-OM1, Free RIMA-OM2) ENDOSCOPIC GREATER SAPHENOUS VEIN HARVEST BILATERAL THIGHS) (N/A) TRANSESOPHAGEAL ECHOCARDIOGRAM (TEE) (N/A) Subjective:  Intubated on vent. Unresponsive, on Precedex and fentanyl.  Objective: Vital signs in last 24 hours:  On levophed 40 mcg, vasopressin .03, neo 100, dop 3 Co-ox 50% Temp:  [92.3 F (33.5 C)-98.7 F (37.1 C)] 97 F (36.1 C) (12/29 0500) Pulse Rate:  [29-138] 117 (12/29 0730) Cardiac Rhythm:  [-] Ventricular paced (12/29 0600) Resp:  [20-33] 30 (12/29 0745) BP: (49-168)/(22-110) 85/63 mmHg (12/29 0700) SpO2:  [65 %-100 %] 95 % (12/29 0730) Arterial Line BP: (35-144)/(20-74) 94/49 mmHg (12/29 0745) FiO2 (%):  [60 %-100 %] 60 % (12/29 0345) Weight:  [96 kg (211 lb 10.3 oz)] 96 kg (211 lb 10.3 oz) (12/29 0515)  Hemodynamic parameters for last 24 hours: CVP:  [10 mmHg-18 mmHg] 17 mmHg  Intake/Output from previous day: 12/28 0701 - 12/29 0700 In: 8664.3 [I.V.:6649.3; Blood:335; NG/GT:30; IV Piggyback:1600] Out: 4971 [Urine:41; Emesis/NG output:1950] Intake/Output this shift:    Neurologic: unresponsive Heart: regular rate and rhythm, S1, S2 normal, no murmur, click, rub or gallop Lungs: clear to auscultation bilaterally Abdomen: distended, tense, no bowel sounds Extremities: anasarca, lower extremities mottled Wound: incision ok  Lab Results:  Recent Labs  09/17/15 0410 09/20/2015 0050  WBC 18.3* 8.2  HGB 9.3* 8.6*  HCT 29.4* 27.4*  PLT 156 121*   BMET:  Recent Labs  09/17/15 1625 09/06/2015 0050  NA 142 138  K 3.3* 3.8  CL 105 101  CO2 18* 14*  GLUCOSE 86 100*  BUN 50* 32*  CREATININE 4.77* 3.69*  CALCIUM 7.9* 7.2*    PT/INR:  Recent Labs  2014/10/08 1410  LABPROT 17.8*  INR 1.46   ABG    Component Value Date/Time   PHART 7.140* 08/27/2015 0431   HCO3 11.6* 08/24/2015 0431   TCO2 13 08/25/2015 0431   ACIDBASEDEF 16.0*  09/09/2015 0431   O2SAT 90.0 08/27/2015 0431   Lactate 14.8  CBG (last 3)   Recent Labs  09/17/15 1929 09/17/15 2319 09/06/2015 0338  GLUCAP 78 114* 101*    Assessment/Plan: S/P Procedure(s) (LRB): CORONARY ARTERY BYPASS GRAFTING x 3 (LIMA-LAD, SVG-OM1, Free RIMA-OM2) ENDOSCOPIC GREATER SAPHENOUS VEIN HARVEST BILATERAL THIGHS) (N/A) TRANSESOPHAGEAL ECHOCARDIOGRAM (TEE) (N/A)  He is critically ill with multi-system organ failure and unremitting metabolic acidosis, vasogenic shock requiring multiple maximum dose vasopressors. I suspect that he has gut ischemia and necrosis and is not a candidate for any surgical therapy due to shock. Since this started suddenly yesterday am he has never been stable enough to even consider CT scan or surgery. 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 I don't think there is anything else we can do for him at this point and prognosis for survival is nil. Will have to discuss with his son and recommend comfort care only.  LOS: 3 days    Alleen BorneBryan K Carah Barrientes 09/14/2015

## 2015-09-21 NOTE — Accreditation Note (Signed)
o Restraints not reported to CMS Pursuant to regulation 482.13 (G) (3) use of soft wrist restraints was logged on 12.30.2016 at 0725 by Rosine DoorAngus Teriana Danker, RN, Patient Safety and Accreditation.

## 2015-09-21 NOTE — Progress Notes (Signed)
eLink Physician-Brief Progress Note Patient Name: Jason HousekeeperDonald Creps DOB: 12/04/1945 MRN: [id number]   Date of Service  2015/01/30  HPI/Events of Note  Patient with metabolic acidosis with pH 7.23/31/60/14.  On Bicarb gtt  eICU Interventions  Plan: Increase vent rate from 14 to 25 Repeat ABG in 2 hours     Intervention Category Major Interventions: Acid-Base disturbance - evaluation and management  DETERDING,ELIZABETH 2015/01/30, 1:24 AM

## 2015-09-21 NOTE — Progress Notes (Signed)
Pt more hypotensive despite starting neo, more mottling noted, agonal breathing on the ventilator. Dr. Darrick Pennaeterding notified and labs drawn.

## 2015-09-21 NOTE — Progress Notes (Signed)
   03-24-2015 1415  Clinical Encounter Type  Visited With Patient and family together  Visit Type Patient actively dying  Referral From Nurse  Spiritual Encounters  Spiritual Needs Grief support;Emotional  Stress Factors  Family Stress Factors Loss  Chaplain spent time with son and brother of patient as they awaited time to remove vent. Prayed for patient.

## 2015-09-21 NOTE — Procedures (Signed)
Admit: 09/17/2015 LOS: 3  26M admit 12/25 with NSTEMI, req emergent CABG 12/26, now with shock, anuric AKI  Current CRRT Prescription: Start Date: 09/17/15 Catheter: R IJ Temp Cath BFR: 200 Pre Blood Pump: 1000 4K DFR: 1500 4K  Replacement Rate: 500 4K  Goal UF: Net Even Anticoagulation: None Clotting: < qShift  S: On 4 pressors, Persistent Acidosis, metabolic dominant Remains Hypotensive  O: 12/28 0701 - 12/29 0700 In: 8664.3 [I.V.:6649.3; Blood:335; NG/GT:30; IV Piggyback:1600] Out: 5021 [Urine:41; Emesis/NG output:2000]  Filed Weights   09/16/15 0500 09/17/15 0630 09-28-2015 0515  Weight: 94 kg (207 lb 3.7 oz) 89.3 kg (196 lb 13.9 oz) 96 kg (211 lb 10.3 oz)     Recent Labs Lab 09/17/15 1510 09/17/15 1625 09/28/2015 0050  NA 140 142 138  K 3.2* 3.3* 3.8  CL 104 105 101  CO2 17* 18* 14*  GLUCOSE 93 86 100*  BUN 55* 50* 32*  CREATININE 5.41* 4.77* 3.69*  CALCIUM 8.2* 7.9* 7.2*  PHOS 5.1*  --  5.9*    Recent Labs Lab 08/25/2015 2302  09/16/15 1736 09/17/15 0410 09/28/2015 0050  WBC 11.2*  < > 16.9* 18.3* 8.2  NEUTROABS 8.9*  --   --   --   --   HGB 12.5*  < > 9.6* 9.3* 8.6*  HCT 37.9*  < > 29.9* 29.4* 27.4*  MCV 90.2  < > 93.4 92.7 95.1  PLT 216  < > 143* 156 121*  < > = values in this interval not displayed.  Scheduled Meds: . antiseptic oral rinse  7 mL Mouth Rinse BID  . antiseptic oral rinse  7 mL Mouth Rinse QID  . atorvastatin  80 mg Oral q1800  . bisacodyl  10 mg Oral Daily   Or  . bisacodyl  10 mg Rectal Daily  . chlorhexidine gluconate  15 mL Mouth Rinse BID  . insulin aspart  0-24 Units Subcutaneous 6 times per day  . pantoprazole (PROTONIX) IV  40 mg Intravenous Q24H  . piperacillin-tazobactam  3.375 g Intravenous Q6H  . sodium chloride  3 mL Intravenous Q12H  . vancomycin  1,000 mg Intravenous Q24H   Continuous Infusions: . sodium chloride Stopped (09/16/15 1040)  . sodium chloride    . sodium chloride 10 mL/hr at 09/16/15 0900  .  sodium chloride 10 mL/hr at 09-28-15 0800  . dexmedetomidine 2 mcg/kg/hr (28-Sep-2015 0800)  . DOPamine 3.26 mcg/kg/min (09/28/2015 0800)  . fentaNYL infusion INTRAVENOUS 25 mcg/hr (Sep 28, 2015 0800)  . norepinephrine (LEVOPHED) Adult infusion 45 mcg/min (Sep 28, 2015 0800)  . phenylephrine (NEO-SYNEPHRINE) Adult infusion 100 mcg/min (Sep 28, 2015 0800)  . dialysis replacement fluid (prismasate) 1,000 mL/hr at 09/28/15 0151  . dialysis replacement fluid (prismasate) 500 mL/hr at 09-28-2015 0151  . dialysate (PRISMASATE) 1,500 mL/hr at 09/28/2015 0517  .  sodium bicarbonate  infusion 1000 mL 200 mL/hr at 28-Sep-2015 0800  . 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 Metabolic Acidosis 3.  Refractoy Shock on 4 pressors 4. NSTEMI s/p CABG 12/26 5. Ileus  Agree with goals of care conversation  Sabra Heckyan Sanford, MD [facility] pgr [phone]

## 2015-09-21 NOTE — Progress Notes (Signed)
Pt's time of death 15:16, confirmed by heart auscultation by Fatima SangerFred Smith RN and Kennith Centerachel Okla Qazi RN. EKG strip with time of asystole printed and placed in chart.. Disconnected CRRT machine afterwards. Chaplain now in room with family.   Jason Bottomsachel R Ciaira Natividad, RN 3:46 PM 09/16/2015

## 2015-09-21 NOTE — Care Management Important Message (Signed)
Important Message  Patient Details  Name: Jason HousekeeperDonald Schroeder MRN: [id number] Date of Birth: 09/23/1945   Medicare Important Message Given:  Yes    Kyla BalzarineShealy, Alejos Reinhardt Abena 2015-06-12, 12:42 PM

## 2015-09-21 NOTE — Progress Notes (Signed)
Pt's son and brother at bedside. Dr. Laneta SimmersBartle came and updated son on pt's severely poor prognosis and code status. Verbalized understanding of situation and that goal is to keep pt comfortable at this time.  Pt made DNR/comfort care per Dr. Laneta SimmersBartle and family decision.  Chaplain called to support family.  Peyton Bottomsachel R Taylie Helder, RN 11:29 AM 06-12-15

## 2015-09-21 NOTE — Progress Notes (Signed)
eLink Physician-Brief Progress Note Patient Name: Jason HousekeeperDonald Zelman DOB: 06/10/1946 MRN: [id number]   Date of Service  08/31/2015  HPI/Events of Note  Worsening metabolic acidosis with pH 7.14/3474/12  eICU Interventions  Plan: Increase RR on vent to 30 2 amps of bicarb IVP Increase bicarb gtt to 200 cc/hr Recheck ABG 2 hours     Intervention Category Major Interventions: Acid-Base disturbance - evaluation and management  Syenna Nazir 09/11/2015, 4:48 AM

## 2015-09-21 NOTE — Progress Notes (Signed)
PULMONARY / CRITICAL CARE MEDICINE   Name: Jason Schroeder MRN: [id number] DOB: 1946-07-02    ADMISSION DATE:  Sep 17, 2015 CONSULTATION DATE:  09/17/15  REFERRING MD:  Dr. Laneta Simmers   CHIEF COMPLAINT:  Hypotension, hypoxia  HISTORY OF PRESENT ILLNESS:   70 y/o M with PMH of anxiety, HTN, HLD, and atrial fibrillation (rate controlled, not on anticoagulation) who went in to [facility] on 12/25 via EMS with a one-week history of intermittent central chest pain.  On admission he was noted to have atrial fibrillation with RVR. The patient reported approximately one hour prior to presentation the chest pain became more severe and constant. The pain also radiated into his neck. EKG evaluation demonstrated atrial fibrillation with RVR, ST elevation in aVR and global ST depression across the precordium. Initial chest x-ray was consistent with pulmonary edema and troponin elevated to 0.54.  He was emergently taken to the Cath Lab where left heart cath demonstrated critical ostial left main stenosis of 95%, occluded small nondominant RCA, mild to moderate LV dysfunction with LVEF of 40-45%, I'll to moderate left ventricular systolic dysfunction. IABP was placed during left heart cath. CVTS was consulted for urgent CABG. The patient underwent CABG 3 (left internal mammary graft to LAD, free right internal mammary graft to the OM 2, SVG to OM1) on 12/26. He was returned to ICU postoperatively and remained in stable condition. He was extubated on 12/27 without difficulty. He remained on low dose Neo-Synephrine, Cardizem and dopamine. Overnight on 12/28 the patient became oliguric with a.m. labs reflecting an increase of serum creatinine to 4.4 and bicarbonate of 16. At shift change on the a.m. of 12/28 he was noted to be tachypneic, less responsive, hypotensive, bluish color and complaints of abdominal pain. He was immediately returned to bed and noted to be unresponsive with a distended abdomen. The patient  was intubated per anesthesia and OG tube inserted with immediate return of 1200 mL of dark green / brown fluid returned.  KUB demonstrated concerns for ileus. He was treated with 500 mL of albumin with an increase in blood pressure. He woke and was able to follow commands.  Subsequently he developed further hypotension and questionable saturation readings. PCCM consulted for evaluation.    The patient was collaboratively managed with Dr. Laneta Simmers.  He was treated with normal saline, Levothroid initiated, 6 amps of bicarbonate push with subsequent bicarbonate drip and new IV access established.  Nephrology was consulted for acidosis and worsening renal failure. Emergent echo completed at bedside with no evidence of tamponade or RV changes concerning for PE.  Arterial line was placed for hemodynamic monitoring.   SUBJECTIVE: Sedated and intubated; remains on 4 pressors and CVVHD, hypotensive with SBPs in the low 70s.    VITAL SIGNS: BP 81/60 mmHg  Pulse 30  Temp(Src) 97 F (36.1 C) (Axillary)  Resp 30  Ht 5\' 4"  (1.626 m)  Wt 96 kg (211 lb 10.3 oz)  BMI 36.31 kg/m2  SpO2 79%  HEMODYNAMICS: CVP:  [10 mmHg-18 mmHg] 17 mmHg  VENTILATOR SETTINGS: Vent Mode:  [-] PRVC FiO2 (%):  [60 %-100 %] 60 % Set Rate:  [16 bmp-30 bmp] 30 bmp Vt Set:  [470 mL-560 mL] 470 mL PEEP:  [8 cmH20-10 cmH20] 10 cmH20 Plateau Pressure:  [25 cmH20-29 cmH20] 29 cmH20  INTAKE / OUTPUT: I/O last 3 completed shifts: In: 9719.5 [P.O.:240; I.V.:7214.5; Blood:335; Other:50; NG/GT:30; IV Piggyback:1850] Out: 5151 [Urine:171; Emesis/NG output:2000; Other:2980]  PHYSICAL EXAMINATION: General:  Critically ill adult male  on vent Neuro:  Sedate, unresponsive to noxious stimulus with sedation off HEENT:  ETT, oral mucosa moist, no JVD  Cardiovascular:  HR 92, s1s2 irr irr, distant tones, midline chest incision c/d/i Lungs:  Tac lungs bilaterally clear, no wheeze / rhonchi Abdomen:  Distended, decreased bowel sounds, NGT  with brown drainage  Musculoskeletal:  No acute deformities  Skin:  Warming blanket on, skin is mottled, negative pedal pulses by doppler,  Faint radial pulses.   LABS:  BMET  Recent Labs Lab 09/17/15 1510 09/17/15 1625 09/19/2015 0050  NA 140 142 138  K 3.2* 3.3* 3.8  CL 104 105 101  CO2 17* 18* 14*  BUN 55* 50* 32*  CREATININE 5.41* 4.77* 3.69*  GLUCOSE 93 86 100*    Electrolytes  Recent Labs Lab 09/16/15 0435 09/16/15 1736  09/17/15 1510 09/17/15 1625 08/29/2015 0050  CALCIUM 8.1*  --   < > 8.2* 7.9* 7.2*  MG 2.9* 2.6*  --   --   --  2.4  PHOS  --   --   --  5.1*  --  5.9*  < > = values in this interval not displayed.  CBC  Recent Labs Lab 09/16/15 1736 09/17/15 0410 08/29/2015 0050  WBC 16.9* 18.3* 8.2  HGB 9.6* 9.3* 8.6*  HCT 29.9* 29.4* 27.4*  PLT 143* 156 121*    Coag's  Recent Labs Lab 09-17-15 0145 09/17/2015 1410  APTT 58* 32  INR 1.15 1.46    Sepsis Markers  Recent Labs Lab 09/17/15 1310 09/17/15 1749 09/02/2015 0050  LATICACIDVEN 12.6* 11.0* 14.8*    ABG  Recent Labs Lab 09/17/15 1116 08/29/2015 0059 09/16/2015 0431  PHART 7.203* 7.234* 7.140*  PCO2ART 35.2 31.5* 33.9*  PO2ART 234.0* 60.0* 74.0*    Liver Enzymes  Recent Labs Lab 09/17/2015 2302 09/17/15 1510 09/05/2015 0050  AST 29  --  3808*  ALT 32  --  3660*  ALKPHOS 95  --  100  BILITOT 0.4  --  1.8*  ALBUMIN 3.6 2.8* 2.7*    Cardiac Enzymes No results for input(s): TROPONINI, PROBNP in the last 168 hours.  Glucose  Recent Labs Lab 09/17/15 1157 09/17/15 1541 09/17/15 1929 09/17/15 2319 09/17/2015 0338 08/21/2015 0819  GLUCAP 102* 77 78 114* 101* 122*    Imaging Dg Chest Port 1 View  08/21/2015  CLINICAL DATA:  CABG EXAM: PORTABLE CHEST 1 VIEW COMPARISON:  09/17/2015 FINDINGS: Support devices are stable. Prior CABG. Cardiomegaly. Bilateral lower lobe airspace opacities have increased with perihilar and interstitial prominence. Small bilateral effusions. No  pneumothorax. IMPRESSION: Worsening interstitial prominence, likely interstitial edema. Increasing bibasilar atelectasis with small effusions. Electronically Signed   By: Charlett Nose M.D.   On: 09/17/2015 08:19   Dg Chest Port 1 View  09/17/2015  CLINICAL DATA:  Central catheter placement EXAM: PORTABLE CHEST 1 VIEW COMPARISON:  Study obtained earlier in the day FINDINGS: New right-sided central catheter tip is in the superior vena cava. Left-sided central catheter tip is at the junction of the left innominate vein and superior vena cava. Endotracheal tube tip is 2.9 cm above the carina. Nasogastric tube tip and side port are below the diaphragm. Temporary pacemaker wires are attached to the right heart. No pneumothorax. Left base atelectasis persists. No new opacity. Heart is prominent, stable. No new opacity. No adenopathy. IMPRESSION: Tube and catheter positions as described without pneumothorax. The new central catheter has its tip in the superior vena cava. No pneumothorax. Stable cardiac prominence and left  base atelectasis. No new opacity. Electronically Signed   By: Bretta Bang III M.D.   On: 09/17/2015 13:56   Dg Chest Port 1 View  09/17/2015  CLINICAL DATA:  Central catheter placement EXAM: PORTABLE CHEST 1 VIEW COMPARISON:  Study obtained earlier in the day FINDINGS: Central catheter tip is at the junction of the left innominate vein and superior vena cava. Endotracheal tube tip is 3.1 cm above the carina. Nasogastric tube tip and side port are below the diaphragm. Temporary pacemaker wires are attached to the right heart. No pneumothorax. There is atelectatic change in left base. Lungs elsewhere clear. Heart is mildly enlarged with pulmonary vascularity within normal limits, stable. No adenopathy. IMPRESSION: Tube and catheter positions as described without pneumothorax. Stable cardiomegaly with left base atelectasis. No new opacity. Electronically Signed   By: Bretta Bang III M.D.    On: 09/17/2015 12:01   Dg Abd Portable 1v  08/23/2015  CLINICAL DATA:  Patient with history of ileus. EXAM: PORTABLE ABDOMEN - 1 VIEW COMPARISON:  Abdominal radiograph 09/17/2015. FINDINGS: Enteric tube tip and side-port project over the stomach. Monitoring lead projects over the lower chest. Epicardial pacing wires. Re- demonstrated unchanged gaseous distention of multiple small bowel loops within the central abdomen. Lumbar spine degenerative changes. IMPRESSION: Re- demonstrated gaseous distended loops of small bowel within the central abdomen representing either small bowel obstruction or potentially severe ileus. Electronically Signed   By: Annia Belt M.D.   On: 08/22/2015 08:22     STUDIES:  12/28  KUB >> concern for ileus  12/28  ECHO >>No pericardial effusions, normal EF, LVH and abnormal septal motion   CULTURES: 12/28 Blood, sputum and urine>>> 12/26>>Presurgical cultures negative  ANTIBIOTICS: Vancomycin 12/28>> Zosyn 12/28>>  SIGNIFICANT EVENTS: 12/25  Admit with chest pain, to [facility] with critical obstruction 12/26  Urgent CABG x3 vessel 12/27  Extubated  12/28  Decompensated - hypotension, elevated lactic acid, ileus  LINES/TUBES: ETT 12/26 >> Cordis 12/26 >> 12/28 L IJ TLC 12/28 >>   DISCUSSION: 70 y/o M with PMH of HTN, anxiety and little prior medical care who was admitted with a one week hx of intermittent chest pain. Subsequently found to have three-vessel coronary disease on left heart cath. He underwent urgent CABG on 12/26. Ultimately was extubated on 12/27.  Decompensated on 12/28 with acute hypotension, mental status change and abdominal pain.  KUB consistent with ileus.  Elevated lactic acid concerning for bowel ischemia. Remains in shock despite pressor support. Overall prognosis is poor. Awaiting family decision regarding care goals   ASSESSMENT / PLAN:  PULMONARY A: Acute Hypoxic Respiratory Failure - no evidence of PE on ECHO, clear CXR Worsening  metabolic acidosis P:   MV support, 8 cc/kg   Wean PEEP / FiO2 for sats > 92% Trend ABG Intermittent CXR  VAP prevention Bicarb gtt Nephrology following  CARDIOVASCULAR A:  Worsening Cardiogenic Shock - in setting of severe acidosis, ileus, concern for possible bowel ischemia NSTEMI  CAD s/p CABG x3 on 12/26 Atrial Fibrillation - off amio / diltiazem, rate controlled  HTN P:  ICU monitoring of hemodynamics  CVP per protocol  Continue max doses of Dopamine, levophed, neo-synephrine and vasopressin  RENAL A:   AG Metabolic Acidosis / Lactic Acidosis - s/p 6 amps bicarb 12/28 + bicarb gtt + calcium  Acute Kidney Injury - in setting of contrast media, hypotension, IABP hx, CHF P:   Nephrology following Trend BMP / UOP, lactic acid  Bicarbonate gtt with  3 amps @ 16725ml/hr Replace electrolytes as indicated  CVVHD per nephrology  GASTROINTESTINAL A:   Ileus - new 12/28 Concern for Bowel Ischemia - elevated lactic acid, hypotension  P:   Unable to obtain imaging studies as patient is too unstable to transport NPO PPI  OGT to LIS for decompression   HEMATOLOGIC A:   Anemia - suspect acute blood loss  P:  Trend BMP  SCD's for DVT prophylaxis   INFECTIOUS A:   Leukocytosis  Post-OP CABG - no evidence of acute infection.  P:   Monitor fever curve / WBC  Pan-culture if febrile  ENDOCRINE A:   Hyperglycemia    P:   SSI Q4  NEUROLOGIC A:   Acute Metabolic Encephalopathy  P:   RASS goal: 0 to -1  Precedex gtt  PRN Fentanyl for pain   FAMILY  - Updates: Son updated on patients status by primary team. Overall prognosis is poor. Awaiting family's decision regarding care goals.   - Inter-disciplinary family meet or Palliative Care meeting due by:  09/25/15   09/20/2015, 10:15 AM

## 2015-09-21 NOTE — Progress Notes (Signed)
Pt's brother and son called this AM, updated on pt's night and current condition. They seem to understand the severity of the pt's condition and said they plan to come to the hospital today to speak with the doctor (did not give specific time).   Peyton Bottomsachel R Sherin Murdoch, RN 9:42 AM 08/21/2015

## 2015-09-21 NOTE — Progress Notes (Signed)
   08/28/2015 1520  Clinical Encounter Type  Visited With Family  Visit Type Death  Referral From Nurse  Spiritual Encounters  Spiritual Needs Emotional;Grief support  Stress Factors  Family Stress Factors Loss  Family requested chaplain's return at point of death. Offered comfort and information.

## 2015-09-21 NOTE — Progress Notes (Signed)
RT note-Patient expired, ventilator discontinued.

## 2015-09-21 DEATH — deceased

## 2015-09-22 LAB — CULTURE, BLOOD (ROUTINE X 2): Culture: NO GROWTH

## 2015-10-22 NOTE — Discharge Summary (Signed)
Physician Discharge Summary  Patient ID: Jason Schroeder MRN: [id number] DOB/AGE: 21-Jun-1946 70 y.o.  Admit date: 2015/10/01 Discharge date: 09/16/2015  Admission Diagnoses:  Hypertension Atrial fibrillation with rapid ventricular response Non-ST segment myocardial infarction Acute pulmonary edema  Discharge Diagnoses:  Principal Problem:   NSTEMI (non-ST elevated myocardial infarction) (HCC) Active Problems:   Hypertension, uncontrolled   Acute pulmonary edema (HCC)   SOB (shortness of breath)   Atrial fibrillation with RVR (HCC)   Ischemic chest pain (HCC)   Acute systolic CHF (congestive heart failure) (HCC)   S/P CABG x 3   Encounter for central line placement   S/P CABG (coronary artery bypass graft)   Shock circulatory (HCC)   Discharged Condition:   Expired  Hospital Course:   The patient was admitted with a one week history of worsening shortness of breath and new onset chest pain. CXR showed pulmonary edema and his troponin was elevated at 0.54. ECG showed diffuse ST depression. Cath shows 95% ostial LM stenosis with 33F catheter dampening. The LCX is dominant and has 40% mid vessel stenosis. The RCA is occluded at the ostium and the distal vessel not seen. LVEF is 40-45% with an LVEDP of 33. He had an IABP inserted for anatomy and was taken to the CCU when he was diuresed over the past several hrs. His pulmonary edema looked improved the next morning. He had no chest pain but did have nausea and vomited once overnight. He was taken to the OR on the morning of 09/17/2015 and underwent CABG x 3. He was weaned off bypass on low dose dopamine and taken to the SICU. The morning following surgery he was weaned off the vent and extubated. He was hemodynamically stable and the IABP was removed. He had stage 3A CKD and his creat went form 1.3 on admission to 2.45 the next morning consistent with acute renal failure probably due to cath followed by surgery 8 hrs later. On the morning  of 09/17/2015 ( POD 2) he was found to have worsening renal function with his creat rising to 4.4, metabolic acidosis with bicarb of 16. He was up to chair this am and reportedly ok but at change of shift he was noted by nurse to be tachypneic with hypotension, less responsive, bluish color and complaining of abdominal pain. He was put back to bed which is when I saw him and he was barely breathing, unresponsive with cuff BP in the 50's, distended abdomen. He was intubated by anesthesia and OG tube inserted and drained 1200 cc of dark fluid immediately with decrease in abdominal distension. He received 500 cc albumin with a rise in BP to 110 and rhythm is chronic a-fib 80's. He has woke up and followed commands and was agitated so sedated . CCM was consulted and his hemodynamics improved somewhat after giving bicarb and starting Levophed. A 2D echo showed normal LV and RV function with no pericardial effusion. He continued to have severe metabolic acidosis that could not be corrected. He required escalating doses of multiple vasopressors and remained hemodynamically stable and anuric on CRRT. 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 Discussion was held with his son and a decision was made to provide comfort care only and he expired on 09/17/2015 at 15:16 hrs.  Consults: pulmonary/intensive care and nephrology  Significant Diagnostic Studies: cardiac graphics: Echocardiogram: normal LV and RV function, no pericardial effusion  Treatments: surgery: CABG x 3 on 08/31/2015  Discharge Exam: Expired  Disposition: 20-Expired Signed: Alleen Borne 10/13/2015, 3:30 PM

## 2016-08-01 IMAGING — CR DG CHEST 1V PORT
1 series · 1 of 1 positions shown · non-contrast
Comparison: Chest radiograph from 09/14/2015

CLINICAL DATA: Intra-aortic balloon pump placement. Initial
encounter.

EXAM:
PORTABLE CHEST 1 VIEW

[AP]
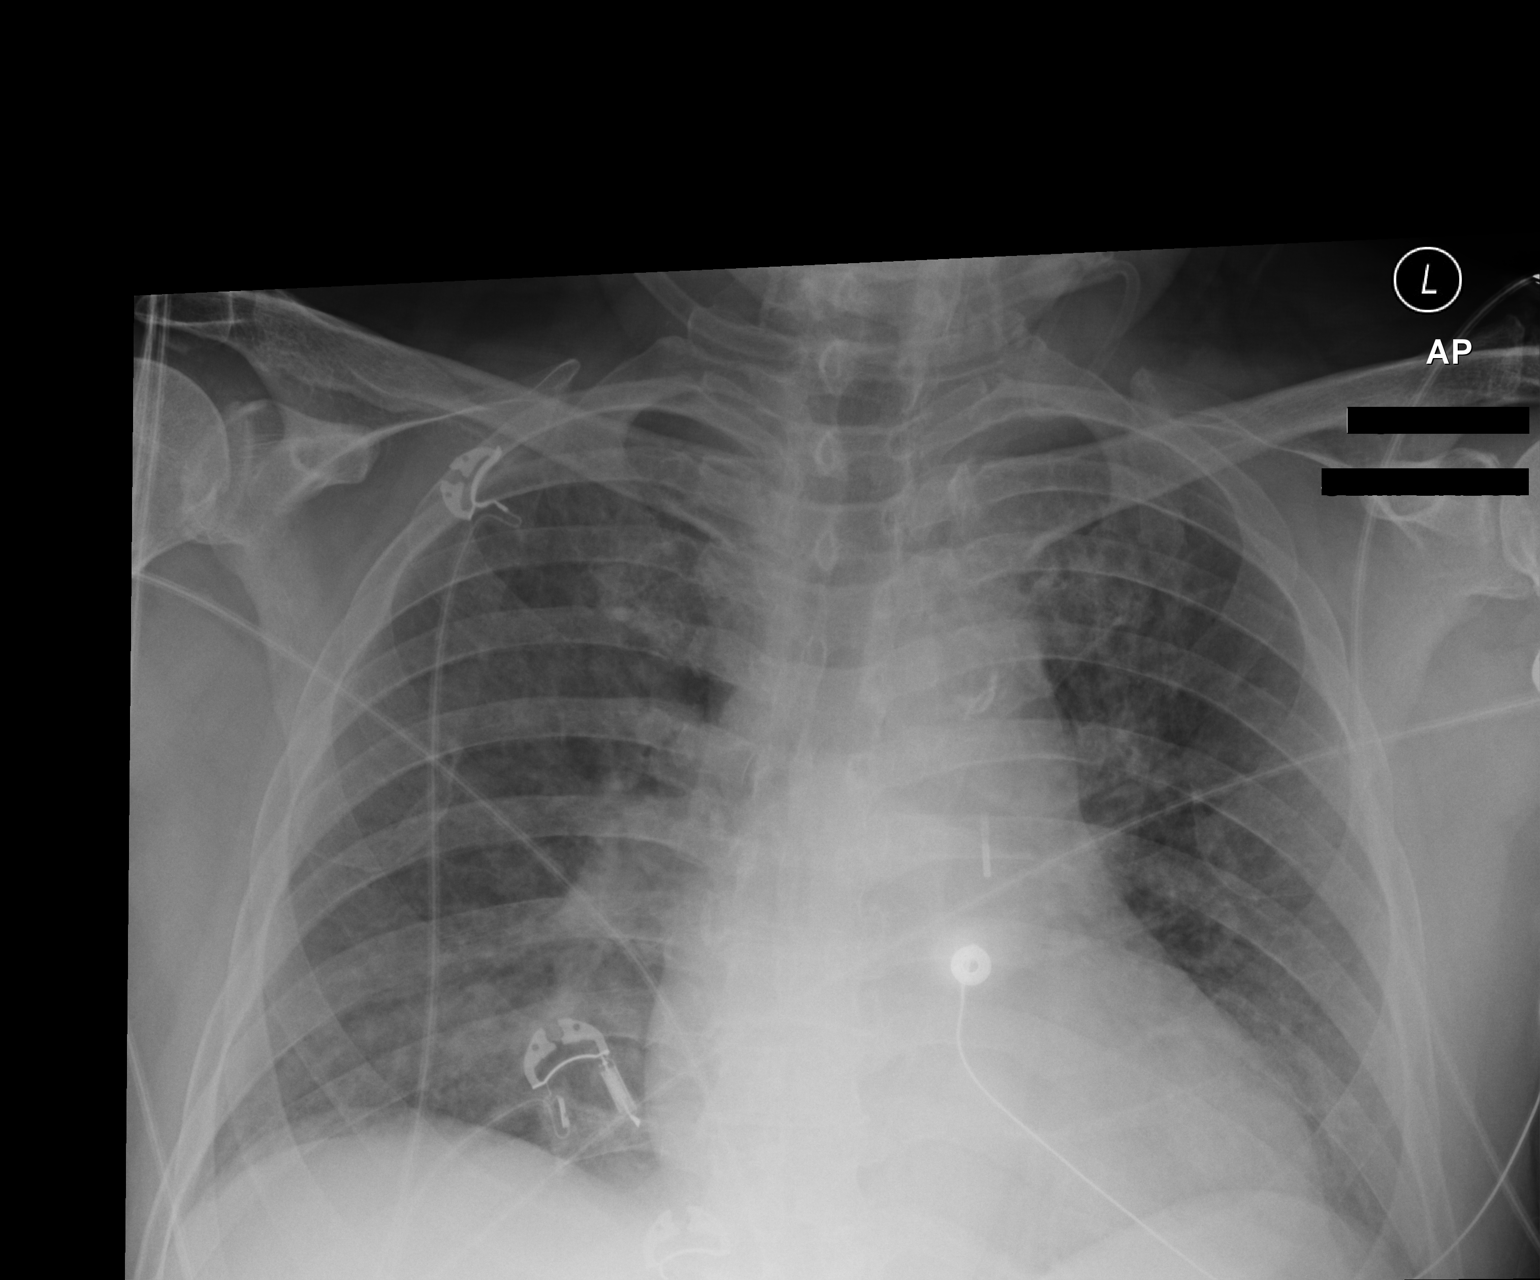

[1 of 1 positions shown; findings below may reference images not displayed]

FINDINGS: The intra-aortic balloon pump tip appears to be within the
descending thoracic aorta, 3 cm below the aortic knob. This could be
advanced approximately 2-3 cm.

Mild vascular congestion is noted. The lungs remain grossly clear.
No focal consolidation, pleural effusion or pneumothorax is seen.

The cardiomediastinal silhouette is borderline normal in size. No
acute osseous abnormalities are identified.
IMPRESSION: 1. Intra-aortic balloon pump tip appears to be within the descending
thoracic aorta, 3 cm below the aortic knob. This could be advanced
approximately 2-3 cm.
2. Mild vascular congestion noted.  Lungs remain grossly clear.

These results were called by telephone at the time of interpretation
on 09/15/2015 at [DATE] to Rossi Attard RN on [id number], who verbally
acknowledged these results.

## 2016-08-03 IMAGING — CR DG CHEST 1V PORT
1 series · 1 of 1 positions shown · non-contrast
Comparison: Study obtained earlier in the day

CLINICAL DATA: Central catheter placement

EXAM:
PORTABLE CHEST 1 VIEW

[AP]
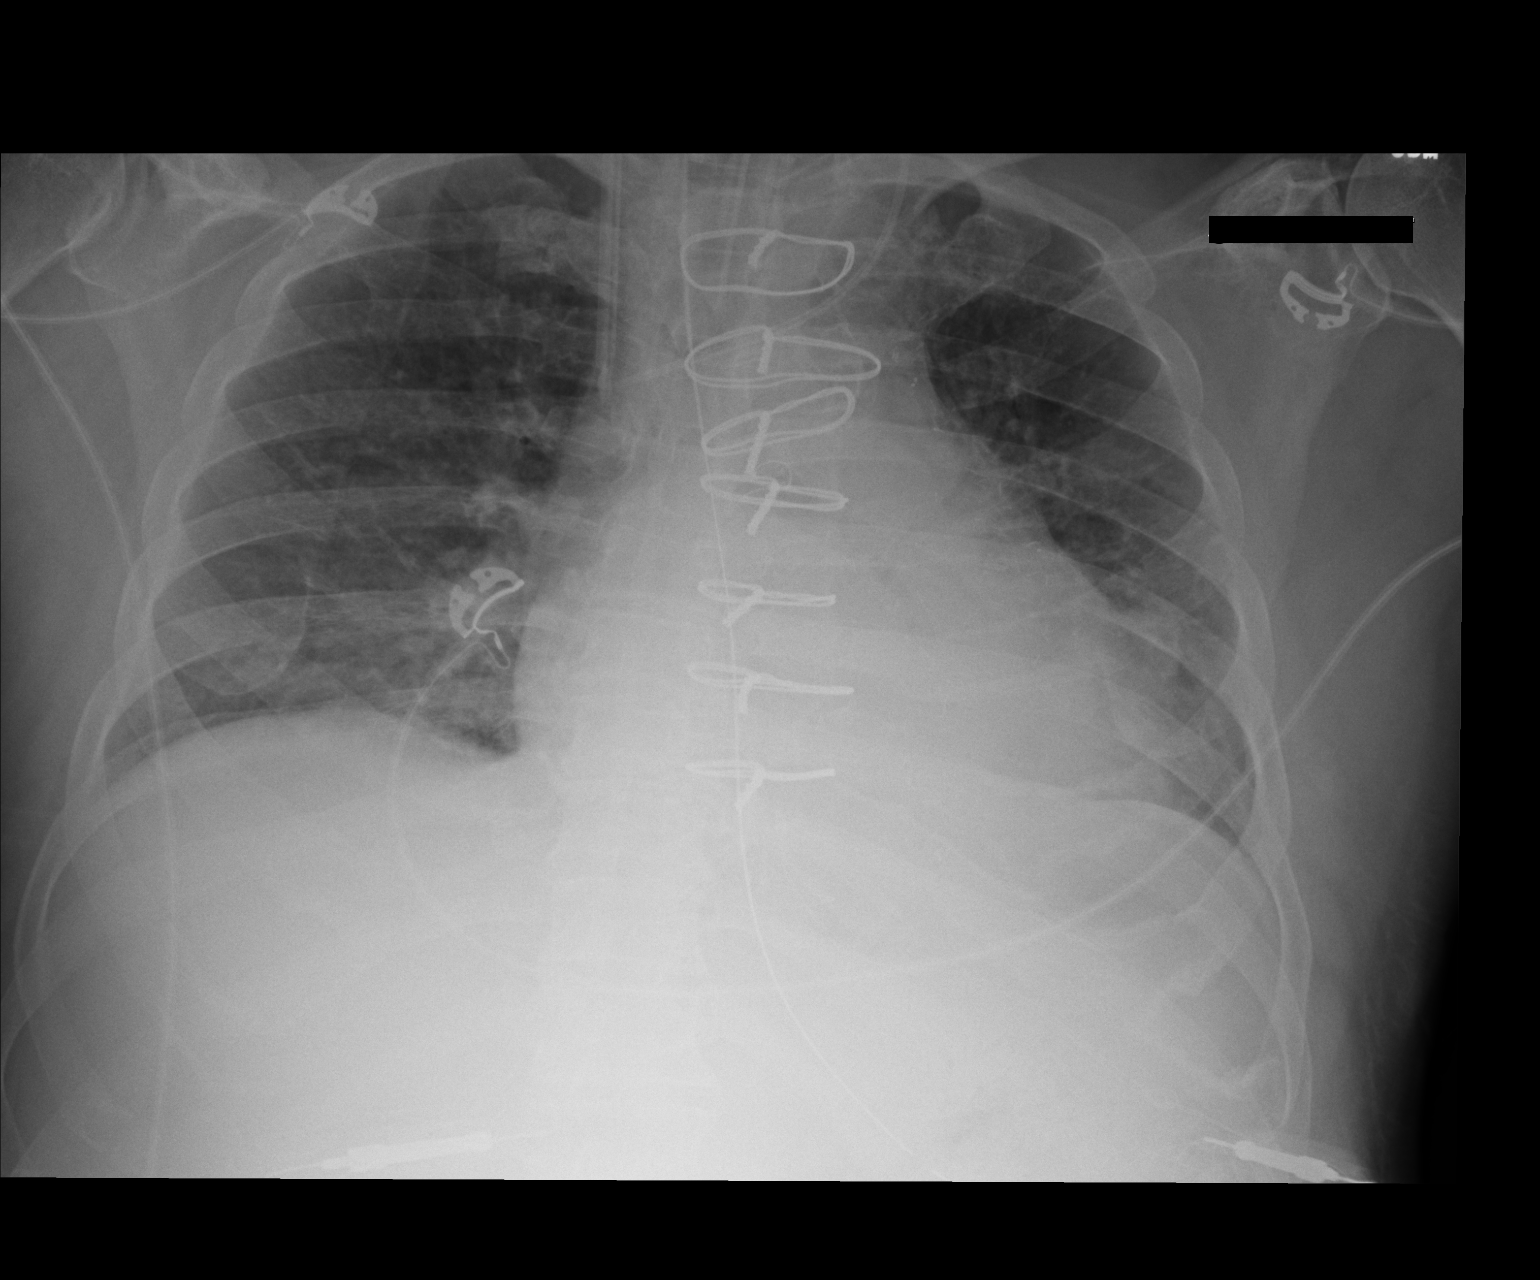

[1 of 1 positions shown; findings below may reference images not displayed]

FINDINGS: New right-sided central catheter tip is in the superior vena cava.
Left-sided central catheter tip is at the junction of the left
innominate vein and superior vena cava. Endotracheal tube tip is
cm above the carina. Nasogastric tube tip and side port are below
the diaphragm. Temporary pacemaker wires are attached to the right
heart. No pneumothorax. Left base atelectasis persists. No new
opacity. Heart is prominent, stable. No new opacity. No adenopathy.
IMPRESSION: Tube and catheter positions as described without pneumothorax. The
new central catheter has its tip in the superior vena cava. No
pneumothorax. Stable cardiac prominence and left base atelectasis.
No new opacity.
